# Patient Record
Sex: Female | Born: 1990 | Race: Black or African American | Hispanic: No | Marital: Married | Smoking: Former smoker
Health system: Southern US, Community
[De-identification: ages and names within clinical notes are randomized; demographics above are authoritative.]

## PROBLEM LIST (undated history)

## (undated) ENCOUNTER — Inpatient Hospital Stay (HOSPITAL_COMMUNITY): Payer: Self-pay

## (undated) DIAGNOSIS — A599 Trichomoniasis, unspecified: Secondary | ICD-10-CM

## (undated) DIAGNOSIS — D649 Anemia, unspecified: Secondary | ICD-10-CM

## (undated) DIAGNOSIS — A749 Chlamydial infection, unspecified: Secondary | ICD-10-CM

## (undated) HISTORY — PX: MULTIPLE TOOTH EXTRACTIONS: SHX2053

---
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
## 2012-08-16 ENCOUNTER — Other Ambulatory Visit (HOSPITAL_COMMUNITY)
Admission: RE | Admit: 2012-08-16 | Discharge: 2012-08-16 | Disposition: A | Discharge status: Home / Self Care | Payer: Self-pay | Source: Ambulatory Visit | Attending: Family Medicine | Admitting: Family Medicine

## 2012-08-16 ENCOUNTER — Encounter (HOSPITAL_COMMUNITY): Payer: Self-pay | Admitting: Emergency Medicine

## 2012-08-16 ENCOUNTER — Emergency Department (INDEPENDENT_AMBULATORY_CARE_PROVIDER_SITE_OTHER)
Admission: EM | Admit: 2012-08-16 | Discharge: 2012-08-16 | Disposition: A | Discharge status: Home / Self Care | Payer: Self-pay | Source: Home / Self Care | Attending: Family Medicine | Admitting: Family Medicine

## 2012-08-16 DIAGNOSIS — N760 Acute vaginitis: Secondary | ICD-10-CM

## 2012-08-16 DIAGNOSIS — Z113 Encounter for screening for infections with a predominantly sexual mode of transmission: Secondary | ICD-10-CM | POA: Insufficient documentation

## 2012-08-16 MED ORDER — METRONIDAZOLE 0.75 % VA GEL: 1.0000 | VAGINAL | Status: DC

## 2012-08-16 NOTE — ED Notes (Signed)
Pt c/o vag discharge x1 month... Sx include: foul odor, itching... Denies: urinary prob, fevers, vomiting, nauseas, diarrhea... She is alert w/no signs of acute distress.

## 2012-08-16 NOTE — ED Provider Notes (Signed)
History     CSN: [id number]  Arrival date & time 08/16/12  1800   First MD Initiated Contact with Patient 08/16/12 1827      Chief Complaint  Patient presents with  . Vaginal Discharge    (Consider location/radiation/quality/duration/timing/severity/associated sxs/prior treatment) Patient is a 21 y.o. female presenting with vaginal discharge. The history is provided by the patient.  Vaginal Discharge This is a new problem. The current episode started more than 1 week ago (1 month h/o d/c, , here today to get checked.). The problem has not changed since onset.Pertinent negatives include no abdominal pain.    History reviewed. No pertinent past medical history.  History reviewed. No pertinent past surgical history.  No family history on file.  History  Substance Use Topics  . Smoking status: Light Tobacco Smoker  . Smokeless tobacco: Not on file  . Alcohol Use: No    OB History    Grav Para Term Preterm Abortions TAB SAB Ect Mult Living                  Review of Systems  Constitutional: Negative.   Gastrointestinal: Negative.  Negative for abdominal pain.  Genitourinary: Positive for vaginal discharge. Negative for dysuria, urgency, frequency and dyspareunia.    Allergies  Review of patient's allergies indicates no known allergies.  Home Medications   Current Outpatient Rx  Name  Route  Sig  Dispense  Refill  . CALAMINE EX LOTN   Topical   Apply 1 application topically 3 (three) times daily.         Marland Kitchen DIPHENHYDRAMINE HCL 25 MG PO CAPS   Oral   Take 25 mg by mouth every 6 (six) hours as needed. For allergy relief         . METRONIDAZOLE 0.75 % VA GEL   Vaginal   Place 1 Applicatorful vaginally 1 day or 1 dose. At bedtime for 5 nights   70 g   0     BP 121/73  Pulse 84  Temp 98.5 F (36.9 C) (Oral)  Resp 16  SpO2 100%  LMP 07/14/2012  Physical Exam  Nursing note and vitals reviewed. Constitutional: She is oriented to person, place, and  time. She appears well-developed and well-nourished.  HENT:  Head: Normocephalic.  Abdominal: Soft. Bowel sounds are normal.  Genitourinary: Uterus normal. Uterus is not enlarged, not fixed and not tender. Cervix exhibits discharge. Cervix exhibits no motion tenderness and no friability. Right adnexum displays no tenderness. Left adnexum displays no tenderness. No erythema or tenderness around the vagina. Vaginal discharge found.  Neurological: She is alert and oriented to person, place, and time.  Skin: Skin is warm and dry.    ED Course  Procedures (including critical care time)  Labs Reviewed - No data to display No results found.   1. Vaginitis       MDM          Linna Hoff, MD 08/16/12 [phone]

## 2012-08-18 NOTE — ED Notes (Signed)
Patient states she was told Rx from 12/30 would be sent over to pharmacy, but pharmacy has no order.  REviewed record - informed pt that the Rx was a printed one, not an electronic one.  Pt denies ever having Rx paper given to her.  OK per Dr. Artis Flock to call Rx in.  Rx called to voicemail [facility] [address] [phone]): metronidazole (Metrogel Vaginal) 0.75% vaginal gel, place 1 applicatorful vaginally 1 day or 1 dose at bedtime for 5 nights, disp: 70g, no refills.

## 2012-08-20 NOTE — ED Notes (Signed)
GC/Chlamydia neg., Affirm: Candida and Trich neg., Gardnerella pos.  Pt. adequately treated with Metrogel. Vassie Moselle 08/20/2012

## 2013-07-23 ENCOUNTER — Encounter (HOSPITAL_COMMUNITY): Payer: Self-pay | Admitting: *Deleted

## 2013-07-23 ENCOUNTER — Inpatient Hospital Stay (HOSPITAL_COMMUNITY)
Admission: AD | Admit: 2013-07-23 | Discharge: 2013-07-23 | Disposition: A | Discharge status: Home / Self Care | Payer: Self-pay | Source: Ambulatory Visit | Attending: Obstetrics & Gynecology | Admitting: Obstetrics & Gynecology

## 2013-07-23 DIAGNOSIS — R109 Unspecified abdominal pain: Secondary | ICD-10-CM | POA: Insufficient documentation

## 2013-07-23 DIAGNOSIS — N946 Dysmenorrhea, unspecified: Secondary | ICD-10-CM | POA: Insufficient documentation

## 2013-07-23 DIAGNOSIS — D500 Iron deficiency anemia secondary to blood loss (chronic): Secondary | ICD-10-CM | POA: Insufficient documentation

## 2013-07-23 LAB — COMPREHENSIVE METABOLIC PANEL
Albumin: 4 g/dL (ref 3.5–5.2)
Alkaline Phosphatase: 41 U/L (ref 39–117)
BUN: 10 mg/dL (ref 6–23)
CO2: 22 mEq/L (ref 19–32)
Chloride: 103 mEq/L (ref 96–112)
GFR calc Af Amer: >90 mL/min (ref >90)
GFR calc non Af Amer: >90 mL/min (ref >90)
Glucose, Bld: 113 mg/dL — ABNORMAL HIGH (ref 70–99)
Potassium: 4.1 mEq/L (ref 3.5–5.1)
Total Bilirubin: 0.3 mg/dL (ref 0.3–1.2)

## 2013-07-23 LAB — URINE MICROSCOPIC-ADD ON

## 2013-07-23 LAB — URINALYSIS, ROUTINE W REFLEX MICROSCOPIC
Bilirubin Urine: NEGATIVE
Glucose, UA: NEGATIVE mg/dL
Specific Gravity, Urine: 1.01 (ref 1.005–1.030)

## 2013-07-23 LAB — CBC
HCT: 30.5 % — ABNORMAL LOW (ref 36.0–46.0)
Hemoglobin: 9.5 g/dL — ABNORMAL LOW (ref 12.0–15.0)
RBC: 4.15 MIL/uL (ref 3.87–5.11)
WBC: 6 10*3/uL (ref 4.0–10.5)

## 2013-07-23 LAB — WET PREP, GENITAL: Yeast Wet Prep HPF POC: NONE SEEN

## 2013-07-23 LAB — POCT PREGNANCY, URINE: Preg Test, Ur: NEGATIVE

## 2013-07-23 MED ORDER — PROMETHAZINE HCL 25 MG PO TABS: 12.5000 mg | ORAL_TABLET | Freq: Four times a day (QID) | ORAL | Status: DC | PRN

## 2013-07-23 MED ORDER — OXYCODONE-ACETAMINOPHEN 5-325 MG PO TABS
2.0000 | ORAL_TABLET | ORAL | Status: AC
  Administered 2013-07-23: 2 via ORAL
  Filled 2013-07-23: qty 2

## 2013-07-23 MED ORDER — DOCUSATE SODIUM 100 MG PO CAPS: 100.0000 mg | ORAL_CAPSULE | Freq: Two times a day (BID) | ORAL | Status: DC | PRN

## 2013-07-23 MED ORDER — ONDANSETRON 8 MG PO TBDP
8.0000 mg | ORAL_TABLET | ORAL | Status: AC
  Administered 2013-07-23: 8 mg via ORAL
  Filled 2013-07-23: qty 1

## 2013-07-23 MED ORDER — KETOROLAC TROMETHAMINE 60 MG/2ML IM SOLN
60.0000 mg | INTRAMUSCULAR | Status: AC
  Administered 2013-07-23: 60 mg via INTRAMUSCULAR
  Filled 2013-07-23: qty 2

## 2013-07-23 MED ORDER — FERROUS SULFATE 325 (65 FE) MG PO TABS: 325.0000 mg | ORAL_TABLET | Freq: Two times a day (BID) | ORAL | Status: DC

## 2013-07-23 NOTE — MAU Provider Note (Signed)
Chief Complaint: Abdominal Pain and Emesis   First Provider Initiated Contact with Patient 07/23/13 1323     SUBJECTIVE HPI: Erica [name] is a 22 y.o.  who presents to maternity admissions reporting abdominal cramping and nausea with vomiting x3 days and report of "feeling weak".  She is currently having her normal menses.  She reports her menses are regular, moderately heavy and last ~7 days.  She has had periods with cramping like this before but never the nausea/vomiting or weakness.  She denies exposure to known illness at home/school/work. She denies vaginal itching/burning, urinary symptoms, h/a, dizziness, or fever/chills.     Past Medical History  Diagnosis Date  . Medical history non-contributory    Past Surgical History  Procedure Laterality Date  . Multiple tooth extractions     History   Social History  . Marital Status: Single    Spouse Name: N/A    Number of Children: N/A  . Years of Education: N/A   Occupational History  . Not on file.   Social History Main Topics  . Smoking status: Light Tobacco Smoker  . Smokeless tobacco: Never Used  . Alcohol Use: No  . Drug Use: No  . Sexual Activity: Yes    Birth Control/ Protection: None   Other Topics Concern  . Not on file   Social History Narrative  . No narrative on file   No current facility-administered medications on file prior to encounter.   No current outpatient prescriptions on file prior to encounter.   No Known Allergies  ROS: Pertinent items in HPI  OBJECTIVE Blood pressure 97/52, pulse 67, temperature 98.1 F (36.7 C), temperature source Oral, resp. rate 22, height 5' 4.75" (1.645 m), weight 50.519 kg (111 lb 6 oz), last menstrual period 07/23/2013. GENERAL: Well-developed, well-nourished female in no acute distress.  HEENT: Normocephalic HEART: normal rate RESP: normal effort ABDOMEN: Soft, non-tender EXTREMITIES: Nontender, no edema NEURO: Alert and oriented Pelvic exam: Cervix  pink, visually closed, without lesion, moderate amount dark red bleeding without clots, vaginal walls and external genitalia normal Bimanual exam: Cervix 0/long/high, firm, anterior, neg CMT, uterus mildly tender, nonenlarged, adnexa without tenderness, enlargement, or mass  LAB RESULTS Results for orders placed during the hospital encounter of 07/23/13 (from the past 24 hour(s))  URINALYSIS, ROUTINE W REFLEX MICROSCOPIC     Status: Abnormal   Collection Time    07/23/13 12:45 PM      Result Value Range   Color, Urine YELLOW  YELLOW   APPearance CLEAR  CLEAR   Specific Gravity, Urine 1.010  1.005 - 1.030   pH 8.5 (*) 5.0 - 8.0   Glucose, UA NEGATIVE  NEGATIVE mg/dL   Hgb urine dipstick TRACE (*) NEGATIVE   Bilirubin Urine NEGATIVE  NEGATIVE   Ketones, ur 15 (*) NEGATIVE mg/dL   Protein, ur 161 (*) NEGATIVE mg/dL   Urobilinogen, UA 1.0  0.0 - 1.0 mg/dL   Nitrite NEGATIVE  NEGATIVE   Leukocytes, UA NEGATIVE  NEGATIVE  URINE MICROSCOPIC-ADD ON     Status: None   Collection Time    07/23/13 12:45 PM      Result Value Range   Squamous Epithelial / LPF RARE  RARE   WBC, UA 0-2  <3 WBC/hpf   RBC / HPF 3-6  <3 RBC/hpf   Bacteria, UA RARE  RARE   Urine-Other MUCOUS PRESENT    POCT PREGNANCY, URINE     Status: None   Collection Time    07/23/13  1:21 PM      Result Value Range   Preg Test, Ur NEGATIVE  NEGATIVE  CBC     Status: Abnormal   Collection Time    07/23/13  1:34 PM      Result Value Range   WBC 6.0  4.0 - 10.5 K/uL   RBC 4.15  3.87 - 5.11 MIL/uL   Hemoglobin 9.5 (*) 12.0 - 15.0 g/dL   HCT 40.9 (*) 81.1 - 91.4 %   MCV 73.5 (*) 78.0 - 100.0 fL   MCH 22.9 (*) 26.0 - 34.0 pg   MCHC 31.1  30.0 - 36.0 g/dL   RDW 78.2 (*) 95.6 - 21.3 %   Platelets 285  150 - 400 K/uL  COMPREHENSIVE METABOLIC PANEL     Status: Abnormal   Collection Time    07/23/13  1:34 PM      Result Value Range   Sodium 137  135 - 145 mEq/L   Potassium 4.1  3.5 - 5.1 mEq/L   Chloride 103  96 - 112  mEq/L   CO2 22  19 - 32 mEq/L   Glucose, Bld 113 (*) 70 - 99 mg/dL   BUN 10  6 - 23 mg/dL   Creatinine, Ser 0.86  0.50 - 1.10 mg/dL   Calcium 9.2  8.4 - 57.8 mg/dL   Total Protein 7.7  6.0 - 8.3 g/dL   Albumin 4.0  3.5 - 5.2 g/dL   AST 23  0 - 37 U/L   ALT 10  0 - 35 U/L   Alkaline Phosphatase 41  39 - 117 U/L   Total Bilirubin 0.3  0.3 - 1.2 mg/dL   GFR calc non Af Amer >90  >90 mL/min   GFR calc Af Amer >90  >90 mL/min  WET PREP, GENITAL     Status: Abnormal   Collection Time    07/23/13  2:10 PM      Result Value Range   Yeast Wet Prep HPF POC NONE SEEN  NONE SEEN   Trich, Wet Prep NONE SEEN  NONE SEEN   Clue Cells Wet Prep HPF POC NONE SEEN  NONE SEEN   WBC, Wet Prep HPF POC FEW (*) NONE SEEN    ASSESSMENT 1. Dysmenorrhea   2. Iron deficiency anemia due to chronic blood loss     PLAN Discharge home Continue OTC ibuprofen for pain Ferrous sulfate BID for symptomatic anemia Colace BID PRN Phenergan 12.5-25 mg Q6 hours PRN F/U in [facility]OC as needed    Medication List         docusate sodium 100 MG capsule  Commonly known as:  COLACE  Take 1 capsule (100 mg total) by mouth 2 (two) times daily as needed.     ferrous sulfate 325 (65 FE) MG tablet  Commonly known as:  FERROUSUL  Take 1 tablet (325 mg total) by mouth 2 (two) times daily.     promethazine 25 MG tablet  Commonly known as:  PHENERGAN  Take 0.5-1 tablets (12.5-25 mg total) by mouth every 6 (six) hours as needed for nausea or vomiting.       Follow-up Information   Follow up with [facility]. (As needed)    Specialty:  Obstetrics and Gynecology   Contact information:   [address] [place] [address] [phone]      Sharen Counter Certified Nurse-Midwife 07/23/2013  2:51 PM

## 2013-07-23 NOTE — MAU Note (Signed)
Patient presents with complaint of severe cramps ( presently on her menstrual cycle) and vomiting X 3 days.

## 2013-07-25 LAB — GC/CHLAMYDIA PROBE AMP: CT Probe RNA: NEGATIVE

## 2014-01-04 ENCOUNTER — Encounter (HOSPITAL_COMMUNITY): Payer: Self-pay | Admitting: Emergency Medicine

## 2014-01-04 ENCOUNTER — Emergency Department (HOSPITAL_COMMUNITY)
Admission: EM | Admit: 2014-01-04 | Discharge: 2014-01-05 | Disposition: A | Discharge status: Home / Self Care | Payer: Self-pay | Attending: Emergency Medicine | Admitting: Emergency Medicine

## 2014-01-04 DIAGNOSIS — Z79899 Other long term (current) drug therapy: Secondary | ICD-10-CM | POA: Insufficient documentation

## 2014-01-04 DIAGNOSIS — R112 Nausea with vomiting, unspecified: Secondary | ICD-10-CM | POA: Insufficient documentation

## 2014-01-04 DIAGNOSIS — R1110 Vomiting, unspecified: Secondary | ICD-10-CM

## 2014-01-04 DIAGNOSIS — D649 Anemia, unspecified: Secondary | ICD-10-CM | POA: Insufficient documentation

## 2014-01-04 DIAGNOSIS — R109 Unspecified abdominal pain: Secondary | ICD-10-CM

## 2014-01-04 DIAGNOSIS — F411 Generalized anxiety disorder: Secondary | ICD-10-CM | POA: Insufficient documentation

## 2014-01-04 DIAGNOSIS — R1013 Epigastric pain: Secondary | ICD-10-CM | POA: Insufficient documentation

## 2014-01-04 DIAGNOSIS — Z3202 Encounter for pregnancy test, result negative: Secondary | ICD-10-CM | POA: Insufficient documentation

## 2014-01-04 DIAGNOSIS — F17200 Nicotine dependence, unspecified, uncomplicated: Secondary | ICD-10-CM | POA: Insufficient documentation

## 2014-01-04 HISTORY — DX: Anemia, unspecified: D64.9

## 2014-01-04 LAB — COMPREHENSIVE METABOLIC PANEL
ALBUMIN: 4.4 g/dL (ref 3.5–5.2)
ALT: 15 U/L (ref 0–35)
AST: 27 U/L (ref 0–37)
Alkaline Phosphatase: 50 U/L (ref 39–117)
BILIRUBIN TOTAL: 0.7 mg/dL (ref 0.3–1.2)
BUN: 11 mg/dL (ref 6–23)
CO2: 20 mEq/L (ref 19–32)
Calcium: 10.1 mg/dL (ref 8.4–10.5)
Chloride: 100 mEq/L (ref 96–112)
Creatinine, Ser: 0.89 mg/dL (ref 0.50–1.10)
GFR calc Af Amer: >90 mL/min (ref >90)
GFR calc non Af Amer: >90 mL/min (ref >90)
Glucose, Bld: 118 mg/dL — ABNORMAL HIGH (ref 70–99)
Potassium: 3.2 mEq/L — ABNORMAL LOW (ref 3.7–5.3)
SODIUM: 141 meq/L (ref 137–147)
TOTAL PROTEIN: 8.7 g/dL — AB (ref 6.0–8.3)

## 2014-01-04 LAB — CBC WITH DIFFERENTIAL/PLATELET
BASOS PCT: 1 % (ref 0–1)
Basophils Absolute: 0.1 10*3/uL (ref 0.0–0.1)
EOS ABS: 0 10*3/uL (ref 0.0–0.7)
Eosinophils Relative: 0 % (ref 0–5)
HCT: 37.8 % (ref 36.0–46.0)
HEMOGLOBIN: 12.7 g/dL (ref 12.0–15.0)
LYMPHS ABS: 1.2 10*3/uL (ref 0.7–4.0)
Lymphocytes Relative: 20 % (ref 12–46)
MCH: 28.6 pg (ref 26.0–34.0)
MCHC: 33.6 g/dL (ref 30.0–36.0)
MCV: 85.1 fL (ref 78.0–100.0)
MONOS PCT: 5 % (ref 3–12)
Monocytes Absolute: 0.3 10*3/uL (ref 0.1–1.0)
NEUTROS ABS: 4.5 10*3/uL (ref 1.7–7.7)
Neutrophils Relative %: 74 % (ref 43–77)
Platelets: 386 10*3/uL (ref 150–400)
RBC: 4.44 MIL/uL (ref 3.87–5.11)
RDW: 13.4 % (ref 11.5–15.5)
WBC: 6.1 10*3/uL (ref 4.0–10.5)

## 2014-01-04 MED ORDER — ONDANSETRON 4 MG PO TBDP: 4.0000 mg | ORAL_TABLET | Freq: Once | ORAL | Status: DC

## 2014-01-04 MED ORDER — SODIUM CHLORIDE 0.9 % IV SOLN
Freq: Once | INTRAVENOUS | Status: AC
  Administered 2014-01-04: 23:00:00 via INTRAVENOUS

## 2014-01-04 MED ORDER — LORAZEPAM 2 MG/ML IJ SOLN
0.5000 mg | Freq: Once | INTRAMUSCULAR | Status: AC
  Administered 2014-01-04: 0.5 mg via INTRAVENOUS
  Filled 2014-01-04: qty 1

## 2014-01-04 NOTE — ED Provider Notes (Signed)
CSN: [id number]     Arrival date & time 01/04/14  2151 History   First MD Initiated Contact with Patient 01/04/14 2206     Chief Complaint  Patient presents with  . Emesis  . Dizziness     (Consider location/radiation/quality/duration/timing/severity/associated sxs/prior Treatment) HPI Comments: Patient presents with complaint of nausea, vomiting, and lower abdominal cramping associated with the onset of her menstrual period beginning at approximately 10 AM today. Patient has had symptoms similar to this over the past year. No past surgical history. No fever, diarrhea, dysuria. Patient is having normal amount of vaginal bleeding for her period. No treatments prior to arrival. Patient states she feels dizzy described as a lightheadedness. She's been told that she's been anemic in the past. Onset of symptoms gradual. Course is gradually worsening. Nothing makes symptoms better worse.  Patient is a 23 y.o. female presenting with vomiting and dizziness. The history is provided by the patient.  Emesis Associated symptoms: abdominal pain   Associated symptoms: no diarrhea, no headaches, no myalgias and no sore throat   Dizziness Associated symptoms: nausea and vomiting   Associated symptoms: no chest pain, no diarrhea and no headaches     Past Medical History  Diagnosis Date  . Medical history non-contributory   . Anemia    Past Surgical History  Procedure Laterality Date  . Multiple tooth extractions     No family history on file. History  Substance Use Topics  . Smoking status: Heavy Tobacco Smoker -- 0.50 packs/day    Types: Cigarettes  . Smokeless tobacco: Never Used  . Alcohol Use: No   OB History   Grav Para Term Preterm Abortions TAB SAB Ect Mult Living                 Review of Systems  Constitutional: Negative for fever.  HENT: Negative for rhinorrhea and sore throat.   Eyes: Negative for redness.  Respiratory: Negative for cough.   Cardiovascular: Negative for  chest pain.  Gastrointestinal: Positive for nausea, vomiting and abdominal pain. Negative for diarrhea.  Genitourinary: Positive for vaginal bleeding. Negative for dysuria, frequency, hematuria, flank pain, vaginal discharge and pelvic pain.  Musculoskeletal: Negative for myalgias.  Skin: Negative for rash.  Neurological: Positive for dizziness and light-headedness. Negative for headaches.  Psychiatric/Behavioral: The patient is nervous/anxious.     Allergies  Review of patient's allergies indicates no known allergies.  Home Medications   Prior to Admission medications   Medication Sig Start Date End Date Taking? Authorizing Provider  bismuth subsalicylate (PEPTO BISMOL) 262 MG chewable tablet Chew 524 mg by mouth as needed (for nausea/vomiting).   Yes Historical Provider, MD  diphenhydramine-acetaminophen (TYLENOL PM) 25-500 MG TABS Take 1-2 tablets by mouth as needed (for sleep).   Yes Historical Provider, MD  ferrous sulfate (FERROUSUL) 325 (65 FE) MG tablet Take 1 tablet (325 mg total) by mouth 2 (two) times daily. 07/23/13  Yes Lisa A Leftwich-Kirby, CNM   BP 101/68  Pulse 52  Temp(Src) 97.9 F (36.6 C) (Oral)  Resp 18  Ht 5\' 4"  (1.626 m)  Wt 110 lb (49.896 kg)  BMI 18.87 kg/m2  SpO2 100%  LMP 01/04/2014  Physical Exam  Nursing note and vitals reviewed. Constitutional: She appears well-developed and well-nourished.  Patient is very anxious and hyperventilating.  HENT:  Head: Normocephalic and atraumatic.  Eyes: Conjunctivae are normal. Right eye exhibits no discharge. Left eye exhibits no discharge.  Neck: Normal range of motion. Neck supple.  Cardiovascular: Normal  rate, regular rhythm and normal heart sounds.   Pulmonary/Chest: Effort normal and breath sounds normal. No respiratory distress. She has no wheezes. She has no rales.  Mild tachypnea  Abdominal: Soft. She exhibits no distension. There is tenderness (Mild, lower abdominal). There is no rebound and no  guarding.  Neurological: She is alert.  Skin: Skin is warm and dry.  Psychiatric: Her mood appears anxious.    ED Course  Procedures (including critical care time) Labs Review Labs Reviewed  COMPREHENSIVE METABOLIC PANEL - Abnormal; Notable for the following:    Potassium 3.2 (*)    Glucose, Bld 118 (*)    Total Protein 8.7 (*)    All other components within normal limits  CBC WITH DIFFERENTIAL  URINALYSIS, ROUTINE W REFLEX MICROSCOPIC  POC URINE PREG, ED    Imaging Review No results found.   EKG Interpretation None      10:39 PM Patient seen and examined. Work-up initiated. Medications ordered.   Vital signs reviewed and are as follows: Filed Vitals:   01/04/14 2201  BP: 101/68  Pulse: 52  Temp: 97.9 F (36.6 C)  Resp: 18   12:19 AM Patient feeling better after antiemetics. Epigastric pain is 4/10. Suprapubic pain is 1/10. She has been unable to urinate. She will start trying ice chips and I have ordered additional bolus. UA/UPT pending.   1:02 AM UPT neg. Manus RuddSchulz NP to f/u on UA. Ensure no infection.   Otherwise patient can go home with pain and nausea medication.   MDM   Final diagnoses:  Vomiting  Abdominal pain   Abdominal cramping and vomiting co-insiding with onset of her period. She has had these symptoms in past which have resolved. No fever. UA pending. Labs reassuring. Epigastric pain likely 2/2 vomiting and suprapubic pain likely 2/2 menstrual cramping.   Vitals are stable, no fever.  No signs of dehydration, tolerating PO's. Lungs are clear. No focal abdominal pain, no concern for appendicitis, cholecystitis, pancreatitis, ruptured viscus, UTI, kidney stone, or any other abdominal etiology. Supportive therapy indicated with return if symptoms worsen. Patient counseled.      Renne CriglerJoshua Salathiel Ferrara, PA-C 01/05/14 0107

## 2014-01-04 NOTE — ED Notes (Signed)
Per pt report: Pt has been vomiting since this morning.  Pt began vomiting at around 13:30 today.  Pt denied vomting any blood.  Pt still has nausea.  Pt reports feeling dizzy. Pt a/o x 4. Skin warm and dry. Ambulatory in triage.

## 2014-01-04 NOTE — ED Notes (Signed)
Pt is resting comfortably at this time.  Friend at bedside.

## 2014-01-04 NOTE — ED Notes (Signed)
Pt reports abd cramping with n/v since 1000 today.  Denies any diarrhea at this time.  Pt reports dizziness and tingling in her legs and arms and lips.  Pt is hyperventilating.  Pt instructed to slow her breathing down and explained to her the reason for the tingling sensations.

## 2014-01-05 LAB — URINALYSIS, ROUTINE W REFLEX MICROSCOPIC
Glucose, UA: NEGATIVE mg/dL
Ketones, ur: >80 mg/dL — AB
Leukocytes, UA: NEGATIVE
NITRITE: NEGATIVE
PH: 6.5 (ref 5.0–8.0)
Protein, ur: 100 mg/dL — AB
SPECIFIC GRAVITY, URINE: 1.043 — AB (ref 1.005–1.030)
UROBILINOGEN UA: 0.2 mg/dL (ref 0.0–1.0)

## 2014-01-05 LAB — POC URINE PREG, ED: PREG TEST UR: NEGATIVE

## 2014-01-05 LAB — URINE MICROSCOPIC-ADD ON

## 2014-01-05 MED ORDER — TRAMADOL HCL 50 MG PO TABS: 50.0000 mg | ORAL_TABLET | Freq: Four times a day (QID) | ORAL | Status: DC | PRN

## 2014-01-05 MED ORDER — SODIUM CHLORIDE 0.9 % IV BOLUS (SEPSIS)
1000.0000 mL | Freq: Once | INTRAVENOUS | Status: AC
  Administered 2014-01-05: 1000 mL via INTRAVENOUS

## 2014-01-05 MED ORDER — ONDANSETRON 4 MG PO TBDP
4.0000 mg | ORAL_TABLET | Freq: Once | ORAL | Status: AC
  Administered 2014-01-05: 4 mg via ORAL
  Filled 2014-01-05: qty 1

## 2014-01-05 MED ORDER — ONDANSETRON 4 MG PO TBDP: 4.0000 mg | ORAL_TABLET | Freq: Three times a day (TID) | ORAL | Status: DC | PRN

## 2014-01-05 NOTE — ED Notes (Signed)
Pt ambulated to the BR with steady gait.   

## 2014-01-05 NOTE — ED Provider Notes (Signed)
Medical screening examination/treatment/procedure(s) were performed by non-physician practitioner and as supervising physician I was immediately available for consultation/collaboration.   EKG Interpretation None        Layla MawKristen N Sanay Belmar, DO 01/05/14 1502

## 2014-01-05 NOTE — Discharge Instructions (Signed)
Please read and follow all provided instructions.  Your diagnoses today include:  1. Vomiting   2. Abdominal pain    Tests performed today include:  Blood counts and electrolytes  Blood tests to check liver and kidney function  Urine test to look for infection and pregnancy (in women)  Vital signs. See below for your results today.   Medications prescribed:   Zofran (ondansetron) - for nausea and vomiting   Tramadol - narcotic-like pain medication  DO NOT drive or perform any activities that require you to be awake and alert because this medicine can make you drowsy.   Take any prescribed medications only as directed.  Home care instructions:   Follow any educational materials contained in this packet.  Follow-up instructions: Please follow-up with your primary care provider in the next 3 days for further evaluation of your symptoms. If you do not have a primary care doctor -- see below for referral information.   Return instructions:  SEEK IMMEDIATE MEDICAL ATTENTION IF:  The pain does not go away or becomes severe   A temperature above 101F develops   Repeated vomiting occurs (multiple episodes)   The pain becomes localized to portions of the abdomen. The right side could possibly be appendicitis. In an adult, the left lower portion of the abdomen could be colitis or diverticulitis.   Blood is being passed in stools or vomit (bright red or black tarry stools)   You develop chest pain, difficulty breathing, dizziness or fainting, or become confused, poorly responsive, or inconsolable (young children)  If you have any other emergent concerns regarding your health  Additional Information: Abdominal (belly) pain can be caused by many things. Your caregiver performed an examination and possibly ordered blood/urine tests and imaging (CT scan, x-rays, ultrasound). Many cases can be observed and treated at home after initial evaluation in the emergency department. Even  though you are being discharged home, abdominal pain can be unpredictable. Therefore, you need a repeated exam if your pain does not resolve, returns, or worsens. Most patients with abdominal pain don't have to be admitted to the hospital or have surgery, but serious problems like appendicitis and gallbladder attacks can start out as nonspecific pain. Many abdominal conditions cannot be diagnosed in one visit, so follow-up evaluations are very important.  Your vital signs today were: BP 101/68   Pulse 52   Temp(Src) 97.9 F (36.6 C) (Oral)   Resp 18   Ht 5\' 4"  (1.626 m)   Wt 110 lb (49.896 kg)   BMI 18.87 kg/m2   SpO2 100%   LMP 01/04/2014 If your blood pressure (bp) was elevated above 135/85 this visit, please have this repeated by your doctor within one month. --------------

## 2014-02-06 ENCOUNTER — Emergency Department (HOSPITAL_COMMUNITY)
Admission: EM | Admit: 2014-02-06 | Discharge: 2014-02-06 | Disposition: A | Discharge status: Home / Self Care | Payer: Self-pay | Attending: Emergency Medicine | Admitting: Emergency Medicine

## 2014-02-06 ENCOUNTER — Emergency Department (HOSPITAL_COMMUNITY): Payer: Self-pay

## 2014-02-06 ENCOUNTER — Encounter (HOSPITAL_COMMUNITY): Payer: Self-pay | Admitting: Emergency Medicine

## 2014-02-06 DIAGNOSIS — D649 Anemia, unspecified: Secondary | ICD-10-CM | POA: Insufficient documentation

## 2014-02-06 DIAGNOSIS — Z3202 Encounter for pregnancy test, result negative: Secondary | ICD-10-CM | POA: Insufficient documentation

## 2014-02-06 DIAGNOSIS — R1084 Generalized abdominal pain: Secondary | ICD-10-CM | POA: Insufficient documentation

## 2014-02-06 DIAGNOSIS — N946 Dysmenorrhea, unspecified: Secondary | ICD-10-CM | POA: Insufficient documentation

## 2014-02-06 DIAGNOSIS — R112 Nausea with vomiting, unspecified: Secondary | ICD-10-CM | POA: Insufficient documentation

## 2014-02-06 DIAGNOSIS — F17200 Nicotine dependence, unspecified, uncomplicated: Secondary | ICD-10-CM | POA: Insufficient documentation

## 2014-02-06 DIAGNOSIS — Z79899 Other long term (current) drug therapy: Secondary | ICD-10-CM | POA: Insufficient documentation

## 2014-02-06 LAB — BASIC METABOLIC PANEL
BUN: 12 mg/dL (ref 6–23)
CALCIUM: 10.1 mg/dL (ref 8.4–10.5)
CO2: 20 meq/L (ref 19–32)
Chloride: 102 mEq/L (ref 96–112)
Creatinine, Ser: 0.77 mg/dL (ref 0.50–1.10)
GFR calc Af Amer: >90 mL/min (ref >90)
GFR calc non Af Amer: >90 mL/min (ref >90)
GLUCOSE: 131 mg/dL — AB (ref 70–99)
Potassium: 3.7 mEq/L (ref 3.7–5.3)
Sodium: 143 mEq/L (ref 137–147)

## 2014-02-06 LAB — URINALYSIS, ROUTINE W REFLEX MICROSCOPIC
Glucose, UA: NEGATIVE mg/dL
Ketones, ur: >80 mg/dL — AB
Leukocytes, UA: NEGATIVE
Nitrite: NEGATIVE
PROTEIN: 100 mg/dL — AB
Specific Gravity, Urine: 1.039 — ABNORMAL HIGH (ref 1.005–1.030)
Urobilinogen, UA: 1 mg/dL (ref 0.0–1.0)
pH: 7.5 (ref 5.0–8.0)

## 2014-02-06 LAB — CBC WITH DIFFERENTIAL/PLATELET
Basophils Absolute: 0 10*3/uL (ref 0.0–0.1)
Basophils Relative: 0 % (ref 0–1)
EOS ABS: 0 10*3/uL (ref 0.0–0.7)
EOS PCT: 0 % (ref 0–5)
HEMATOCRIT: 38.1 % (ref 36.0–46.0)
HEMOGLOBIN: 12.6 g/dL (ref 12.0–15.0)
LYMPHS ABS: 1.2 10*3/uL (ref 0.7–4.0)
LYMPHS PCT: 16 % (ref 12–46)
MCH: 28.2 pg (ref 26.0–34.0)
MCHC: 33.1 g/dL (ref 30.0–36.0)
MCV: 85.2 fL (ref 78.0–100.0)
MONO ABS: 0.3 10*3/uL (ref 0.1–1.0)
MONOS PCT: 4 % (ref 3–12)
Neutro Abs: 6 10*3/uL (ref 1.7–7.7)
Neutrophils Relative %: 80 % — ABNORMAL HIGH (ref 43–77)
Platelets: 351 10*3/uL (ref 150–400)
RBC: 4.47 MIL/uL (ref 3.87–5.11)
RDW: 13.9 % (ref 11.5–15.5)
WBC: 7.4 10*3/uL (ref 4.0–10.5)

## 2014-02-06 LAB — HEPATIC FUNCTION PANEL
ALT: 14 U/L (ref 0–35)
AST: 32 U/L (ref 0–37)
Albumin: 3.8 g/dL (ref 3.5–5.2)
Alkaline Phosphatase: 40 U/L (ref 39–117)
BILIRUBIN TOTAL: 0.4 mg/dL (ref 0.3–1.2)
Bilirubin, Direct: <0.2 mg/dL (ref 0.0–0.3)
Total Protein: 7.3 g/dL (ref 6.0–8.3)

## 2014-02-06 LAB — URINE MICROSCOPIC-ADD ON

## 2014-02-06 LAB — I-STAT TROPONIN, ED: Troponin i, poc: 0 ng/mL (ref 0.00–0.08)

## 2014-02-06 LAB — PREGNANCY, URINE: Preg Test, Ur: NEGATIVE

## 2014-02-06 LAB — LIPASE, BLOOD: Lipase: 18 U/L (ref 11–59)

## 2014-02-06 MED ORDER — ONDANSETRON 4 MG PO TBDP: 4.0000 mg | ORAL_TABLET | Freq: Three times a day (TID) | ORAL | Status: DC | PRN

## 2014-02-06 MED ORDER — MORPHINE SULFATE 4 MG/ML IJ SOLN
4.0000 mg | Freq: Once | INTRAMUSCULAR | Status: AC
  Administered 2014-02-06: 4 mg via INTRAVENOUS
  Filled 2014-02-06: qty 1

## 2014-02-06 MED ORDER — HYDROCODONE-ACETAMINOPHEN 5-325 MG PO TABS: 1.0000 | ORAL_TABLET | ORAL | Status: DC | PRN

## 2014-02-06 MED ORDER — ONDANSETRON HCL 4 MG/2ML IJ SOLN
4.0000 mg | Freq: Once | INTRAMUSCULAR | Status: AC
  Administered 2014-02-06: 4 mg via INTRAVENOUS
  Filled 2014-02-06: qty 2

## 2014-02-06 MED ORDER — SODIUM CHLORIDE 0.9 % IV BOLUS (SEPSIS)
1000.0000 mL | Freq: Once | INTRAVENOUS | Status: AC
  Administered 2014-02-06: 1000 mL via INTRAVENOUS

## 2014-02-06 NOTE — ED Provider Notes (Signed)
CSN: [id number]     Arrival date & time 02/06/14  [id number] History   First MD Initiated Contact with Patient 02/06/14 0901     Chief Complaint  Patient presents with  . Abdominal Pain  . Emesis     (Consider location/radiation/quality/duration/timing/severity/associated sxs/prior Treatment) HPI Comments: Patient is a 23 year old female with no past medical history who presents with abdominal pain that started last night. The pain is located in her generalized abdomen and does not radiate. The pain is described as cramping and severe. The pain started gradually and progressively worsened since the onset. No alleviating/aggravating factors. The patient has tried nothing for symptoms without relief. Associated symptoms include chest pain, nausea, and vomiting. Patient denies fever, headache, diarrhea, SOB, dysuria, constipation, abnormal vaginal bleeding/discharge. Patient is currently menstruating.      Past Medical History  Diagnosis Date  . Medical history non-contributory   . Anemia    Past Surgical History  Procedure Laterality Date  . Multiple tooth extractions     No family history on file. History  Substance Use Topics  . Smoking status: Heavy Tobacco Smoker -- 0.50 packs/day    Types: Cigarettes  . Smokeless tobacco: Never Used  . Alcohol Use: No   OB History   Grav Para Term Preterm Abortions TAB SAB Ect Mult Living                 Review of Systems  Constitutional: Negative for fever, chills and fatigue.  HENT: Negative for trouble swallowing.   Eyes: Negative for visual disturbance.  Respiratory: Negative for shortness of breath.   Cardiovascular: Negative for chest pain and palpitations.  Gastrointestinal: Positive for nausea, vomiting and abdominal pain. Negative for diarrhea.  Genitourinary: Negative for dysuria and difficulty urinating.  Musculoskeletal: Negative for arthralgias and neck pain.  Skin: Negative for color change.  Neurological: Negative for  dizziness and weakness.  Psychiatric/Behavioral: Negative for dysphoric mood.      Allergies  Review of patient's allergies indicates no known allergies.  Home Medications   Prior to Admission medications   Medication Sig Start Date End Date Taking? Authorizing Provider  bismuth subsalicylate (PEPTO BISMOL) 262 MG chewable tablet Chew 524 mg by mouth as needed (for nausea/vomiting).    Historical Provider, MD  diphenhydramine-acetaminophen (TYLENOL PM) 25-500 MG TABS Take 1-2 tablets by mouth as needed (for sleep).    Historical Provider, MD  ferrous sulfate (FERROUSUL) 325 (65 FE) MG tablet Take 1 tablet (325 mg total) by mouth 2 (two) times daily. 07/23/13   Lisa A Leftwich-Kirby, CNM  ondansetron (ZOFRAN ODT) 4 MG disintegrating tablet Take 1 tablet (4 mg total) by mouth every 8 (eight) hours as needed for nausea or vomiting. 01/05/14   Renne CriglerJoshua Geiple, PA-C  traMADol (ULTRAM) 50 MG tablet Take 1 tablet (50 mg total) by mouth every 6 (six) hours as needed. 01/05/14   Renne CriglerJoshua Geiple, PA-C   BP 114/57  Pulse 74  Temp(Src) 98.8 F (37.1 C) (Oral)  Resp 23  Ht 5\' 4"  (1.626 m)  Wt 110 lb (49.896 kg)  BMI 18.87 kg/m2  SpO2 100%  LMP 02/05/2014 Physical Exam  Nursing note and vitals reviewed. Constitutional: She is oriented to person, place, and time. She appears well-developed and well-nourished. No distress.  HENT:  Head: Normocephalic and atraumatic.  Eyes: Conjunctivae and EOM are normal. No scleral icterus.  Neck: Normal range of motion.  Cardiovascular: Normal rate and regular rhythm.  Exam reveals no gallop and no friction  rub.   No murmur heard. Pulmonary/Chest: Effort normal and breath sounds normal. She has no wheezes. She has no rales. She exhibits no tenderness.  Abdominal: Soft. She exhibits no distension. There is tenderness. There is no rebound and no guarding.  Generalized tenderness to palpation without focal tenderness or peritoneal signs.   Musculoskeletal: Normal  range of motion.  Neurological: She is alert and oriented to person, place, and time. Coordination normal.  Speech is goal-oriented. Moves limbs without ataxia.   Skin: Skin is warm and dry.  Psychiatric: She has a normal mood and affect. Her behavior is normal.    ED Course  Procedures (including critical care time) Labs Review Labs Reviewed  CBC WITH DIFFERENTIAL - Abnormal; Notable for the following:    Neutrophils Relative % 80 (*)    All other components within normal limits  BASIC METABOLIC PANEL - Abnormal; Notable for the following:    Glucose, Bld 131 (*)    All other components within normal limits  URINALYSIS, ROUTINE W REFLEX MICROSCOPIC - Abnormal; Notable for the following:    Color, Urine AMBER (*)    APPearance CLOUDY (*)    Specific Gravity, Urine 1.039 (*)    Hgb urine dipstick LARGE (*)    Bilirubin Urine SMALL (*)    Ketones, ur >80 (*)    Protein, ur 100 (*)    All other components within normal limits  PREGNANCY, URINE  URINE MICROSCOPIC-ADD ON  LIPASE, BLOOD  HEPATIC FUNCTION PANEL  I-STAT TROPOININ, ED    Imaging Review Dg Chest 2 View  02/06/2014   CLINICAL DATA:  Chest pain, vomiting  EXAM: CHEST  2 VIEW  COMPARISON:  None.  FINDINGS: The heart size and mediastinal contours are within normal limits. Both lungs are clear. The visualized skeletal structures are unremarkable.  IMPRESSION: No active cardiopulmonary disease.   Electronically Signed   By: Charlett NoseKevin  Dover M.D.   On: 02/06/2014 11:27     EKG Interpretation   Date/Time:  Monday February 06 2014 08:56:23 EDT Ventricular Rate:  56 PR Interval:  126 QRS Duration: 86 QT Interval:  458 QTC Calculation: 441 R Axis:   91 Text Interpretation:  Sinus bradycardia Rightward axis T wave abnormality,  consider inferior ischemia T wave abnormality, consider anterolateral  ischemia Abnormal ECG No previous tracing Confirmed by KNAPP  MD-J, JON  ([id number]) on 02/06/2014 10:30:25 AM      MDM   Final  diagnoses:  Menstrual cramps  Nausea and vomiting in adult    9:34 AM Labs and urinalysis pending. Patient will have fluids, morphine, and zofran for symptoms. Vitals stable and patient afebrile. Patient is PERC negative.   1:01 PM Labs and chest xray unremarkable for acute changes. EKG shows T wave abnormality. Troponin negative. Patient is eating lunch now. Patient likely has viral illness. Vitals stable and patient feeling "much better." Patient will be discharged with phenergan for nausea. Patient advised to return with worsening or concerning symptoms. Patient given coupons for zofran and pain medication. Patient is being set up with the orange card program.     Emilia BeckKaitlyn Szekalski, PA-C 02/06/14 1340

## 2014-02-06 NOTE — ED Notes (Signed)
Pt. Stated, i started throwing up last night around 0100 and having abdominal pain and chest pain

## 2014-02-06 NOTE — Discharge Instructions (Signed)
[facility] symptoms. Take Vicodin as needed for pain. Take zofran as needed for nausea. Refer to attached documents for more information.

## 2014-02-06 NOTE — ED Provider Notes (Signed)
Medical screening examination/treatment/procedure(s) were performed by non-physician practitioner and as supervising physician I was immediately available for consultation/collaboration.   EKG Interpretation   Date/Time:  Monday February 06 2014 08:56:23 EDT Ventricular Rate:  56 PR Interval:  126 QRS Duration: 86 QT Interval:  458 QTC Calculation: 441 R Axis:   91 Text Interpretation:  Sinus bradycardia Rightward axis T wave abnormality,  consider inferior ischemia T wave abnormality, consider anterolateral  ischemia Abnormal ECG No previous tracing Confirmed by Erica Pineiro  MD-J, Erica [name]  ([id number]) on 02/06/2014 10:30:25 AM       Erica DibblesJon Bode Pieper, MD 02/06/14 1346

## 2014-02-07 NOTE — Discharge Planning (Signed)
[id number] Community Liaison  Spoke to patient about primary care resources and establishing care with a provider. Patient was given the orange card application and instructed to contact me once the application was complete. Resource guide and my contact information was also given for any future questions or concerns.

## 2014-05-14 ENCOUNTER — Encounter (HOSPITAL_COMMUNITY): Payer: Self-pay | Admitting: Emergency Medicine

## 2014-05-14 ENCOUNTER — Emergency Department (HOSPITAL_COMMUNITY)
Admission: EM | Admit: 2014-05-14 | Discharge: 2014-05-15 | Disposition: A | Discharge status: Home / Self Care | Payer: Self-pay | Attending: Emergency Medicine | Admitting: Emergency Medicine

## 2014-05-14 DIAGNOSIS — N390 Urinary tract infection, site not specified: Secondary | ICD-10-CM | POA: Insufficient documentation

## 2014-05-14 DIAGNOSIS — Z3202 Encounter for pregnancy test, result negative: Secondary | ICD-10-CM | POA: Insufficient documentation

## 2014-05-14 DIAGNOSIS — F17200 Nicotine dependence, unspecified, uncomplicated: Secondary | ICD-10-CM | POA: Insufficient documentation

## 2014-05-14 DIAGNOSIS — R630 Anorexia: Secondary | ICD-10-CM | POA: Insufficient documentation

## 2014-05-14 DIAGNOSIS — R112 Nausea with vomiting, unspecified: Secondary | ICD-10-CM | POA: Insufficient documentation

## 2014-05-14 DIAGNOSIS — Z79899 Other long term (current) drug therapy: Secondary | ICD-10-CM | POA: Insufficient documentation

## 2014-05-14 DIAGNOSIS — R42 Dizziness and giddiness: Secondary | ICD-10-CM | POA: Insufficient documentation

## 2014-05-14 DIAGNOSIS — D649 Anemia, unspecified: Secondary | ICD-10-CM | POA: Insufficient documentation

## 2014-05-14 LAB — URINALYSIS, ROUTINE W REFLEX MICROSCOPIC
Glucose, UA: 100 mg/dL — AB
Ketones, ur: >80 mg/dL — AB
Nitrite: POSITIVE — AB
PH: 5 (ref 5.0–8.0)
Protein, ur: >300 mg/dL — AB
Specific Gravity, Urine: 1.035 — ABNORMAL HIGH (ref 1.005–1.030)
Urobilinogen, UA: 2 mg/dL — ABNORMAL HIGH (ref 0.0–1.0)

## 2014-05-14 LAB — CBC WITH DIFFERENTIAL/PLATELET
BASOS ABS: 0.1 10*3/uL (ref 0.0–0.1)
BASOS PCT: 1 % (ref 0–1)
EOS ABS: 0.1 10*3/uL (ref 0.0–0.7)
Eosinophils Relative: 1 % (ref 0–5)
HCT: 36.7 % (ref 36.0–46.0)
Hemoglobin: 12.3 g/dL (ref 12.0–15.0)
Lymphocytes Relative: 30 % (ref 12–46)
Lymphs Abs: 1.9 10*3/uL (ref 0.7–4.0)
MCH: 28.6 pg (ref 26.0–34.0)
MCHC: 33.5 g/dL (ref 30.0–36.0)
MCV: 85.3 fL (ref 78.0–100.0)
Monocytes Absolute: 0.5 10*3/uL (ref 0.1–1.0)
Monocytes Relative: 8 % (ref 3–12)
Neutro Abs: 3.9 10*3/uL (ref 1.7–7.7)
Neutrophils Relative %: 60 % (ref 43–77)
Platelets: 279 10*3/uL (ref 150–400)
RBC: 4.3 MIL/uL (ref 3.87–5.11)
RDW: 14.1 % (ref 11.5–15.5)
WBC: 6.4 10*3/uL (ref 4.0–10.5)

## 2014-05-14 LAB — URINE MICROSCOPIC-ADD ON

## 2014-05-14 LAB — COMPREHENSIVE METABOLIC PANEL
ALK PHOS: 46 U/L (ref 39–117)
ALT: 13 U/L (ref 0–35)
AST: 25 U/L (ref 0–37)
Albumin: 4.3 g/dL (ref 3.5–5.2)
Anion gap: 17 — ABNORMAL HIGH (ref 5–15)
BUN: 9 mg/dL (ref 6–23)
CO2: 21 mEq/L (ref 19–32)
Calcium: 9.5 mg/dL (ref 8.4–10.5)
Chloride: 103 mEq/L (ref 96–112)
Creatinine, Ser: 0.83 mg/dL (ref 0.50–1.10)
GFR calc Af Amer: >90 mL/min (ref >90)
GFR calc non Af Amer: >90 mL/min (ref >90)
Glucose, Bld: 86 mg/dL (ref 70–99)
POTASSIUM: 3.5 meq/L — AB (ref 3.7–5.3)
Sodium: 141 mEq/L (ref 137–147)
Total Bilirubin: 0.9 mg/dL (ref 0.3–1.2)
Total Protein: 7.9 g/dL (ref 6.0–8.3)

## 2014-05-14 LAB — LIPASE, BLOOD: Lipase: 16 U/L (ref 11–59)

## 2014-05-14 MED ORDER — CIPROFLOXACIN HCL 500 MG PO TABS: 500.0000 mg | ORAL_TABLET | Freq: Two times a day (BID) | ORAL | Status: DC

## 2014-05-14 MED ORDER — SODIUM CHLORIDE 0.9 % IV BOLUS (SEPSIS)
1000.0000 mL | Freq: Once | INTRAVENOUS | Status: AC
  Administered 2014-05-14: 1000 mL via INTRAVENOUS

## 2014-05-14 MED ORDER — ONDANSETRON HCL 4 MG/2ML IJ SOLN
4.0000 mg | Freq: Once | INTRAMUSCULAR | Status: AC
  Administered 2014-05-14: 4 mg via INTRAVENOUS
  Filled 2014-05-14: qty 2

## 2014-05-14 MED ORDER — CIPROFLOXACIN IN D5W 400 MG/200ML IV SOLN
400.0000 mg | Freq: Once | INTRAVENOUS | Status: AC
  Administered 2014-05-14: 400 mg via INTRAVENOUS
  Filled 2014-05-14: qty 200

## 2014-05-14 MED ORDER — HYDROCODONE-ACETAMINOPHEN 5-325 MG PO TABS: 1.0000 | ORAL_TABLET | Freq: Four times a day (QID) | ORAL | Status: DC | PRN

## 2014-05-14 NOTE — Discharge Instructions (Signed)
As discussed, it is important that you follow up as soon as possible with your physician for continued management of your condition. ° °If you develop any new, or concerning changes in your condition, please return to the emergency department immediately. ° °

## 2014-05-14 NOTE — ED Notes (Signed)
Introduced myself to the patient.  She is comfortable and resting. 

## 2014-05-14 NOTE — ED Provider Notes (Signed)
CSN: [id number]     Arrival date & time 05/14/14  1604 History   First MD Initiated Contact with Patient 05/14/14 1820     Chief Complaint  Patient presents with  . Emesis  . Dizziness     (Consider location/radiation/quality/duration/timing/severity/associated sxs/prior Treatment) HPI Patient presents with concern nausea, vomiting, diffuse abdominal pain.  Symptoms began when the patient awoke approximately 12 hours ago. Since onset she said diffuse crampy abdominal discomfort, persistent anorexia, nausea, with multiple episodes of emesis, 3 episodes of loose stool. There are associated subjective fever, chills. Patient notes that her boyfriend has similar symptoms. She is generally well-appearing  Past Medical History  Diagnosis Date  . Medical history non-contributory   . Anemia    Past Surgical History  Procedure Laterality Date  . Multiple tooth extractions     No family history on file. History  Substance Use Topics  . Smoking status: Heavy Tobacco Smoker -- 0.50 packs/day    Types: Cigarettes  . Smokeless tobacco: Never Used  . Alcohol Use: No   OB History   Grav Para Term Preterm Abortions TAB SAB Ect Mult Living                 Review of Systems  Constitutional:       Per HPI, otherwise negative  HENT:       Per HPI, otherwise negative  Respiratory:       Per HPI, otherwise negative  Cardiovascular:       Per HPI, otherwise negative  Gastrointestinal: Positive for nausea, vomiting and diarrhea.  Endocrine:       Negative aside from HPI  Genitourinary:       Neg aside from HPI   Musculoskeletal:       Per HPI, otherwise negative  Skin: Negative.   Neurological: Negative for syncope.      Allergies  Review of patient's allergies indicates no known allergies.  Home Medications   Prior to Admission medications   Medication Sig Start Date End Date Taking? Authorizing Provider  bismuth subsalicylate (PEPTO BISMOL) 262 MG chewable tablet Chew 524  mg by mouth as needed (for nausea/vomiting).    Historical Provider, MD  diphenhydramine-acetaminophen (TYLENOL PM) 25-500 MG TABS Take 1-2 tablets by mouth as needed (for sleep).    Historical Provider, MD  ferrous sulfate (FERROUSUL) 325 (65 FE) MG tablet Take 1 tablet (325 mg total) by mouth 2 (two) times daily. 07/23/13   Wilmer Floor Leftwich-Kirby, CNM  HYDROcodone-acetaminophen (NORCO/VICODIN) 5-325 MG per tablet Take 1-2 tablets by mouth every 4 (four) hours as needed for moderate pain or severe pain. 02/06/14   Kaitlyn Szekalski, PA-C  ondansetron (ZOFRAN ODT) 4 MG disintegrating tablet Take 1 tablet (4 mg total) by mouth every 8 (eight) hours as needed for nausea or vomiting. 02/06/14   Emilia Beck, PA-C  traMADol (ULTRAM) 50 MG tablet Take 50 mg by mouth every 6 (six) hours as needed for moderate pain.    Historical Provider, MD   BP 91/53  Pulse 68  Temp(Src) 98 F (36.7 C) (Oral)  Resp 20  SpO2 100% Physical Exam  Nursing note and vitals reviewed. Constitutional: She is oriented to person, place, and time. She appears well-developed and well-nourished. No distress.  HENT:  Head: Normocephalic and atraumatic.  Eyes: Conjunctivae and EOM are normal.  Cardiovascular: Normal rate and regular rhythm.   Pulmonary/Chest: Effort normal and breath sounds normal. No stridor. No respiratory distress.  Abdominal: She exhibits no distension. There  is generalized tenderness. There is no rigidity, no rebound and no guarding.  Musculoskeletal: She exhibits no edema.  Neurological: She is alert and oriented to person, place, and time. No cranial nerve deficit.  Skin: Skin is warm and dry.  Psychiatric: She has a normal mood and affect.    ED Course  Procedures (including critical care time) Labs Review Labs Reviewed  COMPREHENSIVE METABOLIC PANEL - Abnormal; Notable for the following:    Potassium 3.5 (*)    Anion gap 17 (*)    All other components within normal limits  URINALYSIS,  ROUTINE W REFLEX MICROSCOPIC - Abnormal; Notable for the following:    Color, Urine RED (*)    APPearance TURBID (*)    Specific Gravity, Urine 1.035 (*)    Glucose, UA 100 (*)    Hgb urine dipstick LARGE (*)    Bilirubin Urine LARGE (*)    Ketones, ur >80 (*)    Protein, ur >300 (*)    Urobilinogen, UA 2.0 (*)    Nitrite POSITIVE (*)    Leukocytes, UA LARGE (*)    All other components within normal limits  URINE MICROSCOPIC-ADD ON - Abnormal; Notable for the following:    Bacteria, UA MANY (*)    All other components within normal limits  URINE CULTURE  CBC WITH DIFFERENTIAL  LIPASE, BLOOD    11:41 PM On repeat exam the patient is awake, alert, in no distress. MDM   Final diagnoses:  UTI (lower urinary tract infection)    Previously well young female presents with new diffuse abdominal discomfort, nausea, vomiting, no sign of a urinary tract infection.  Patient is afebrile in no distress. Low suspicion for pyelonephritis. Patient presents initially with fluids, antibiotics.  Patient was discharged in stable condition to follow up with primary care.,     Gerhard Munch, MD 05/15/14 [phone]

## 2014-05-14 NOTE — ED Notes (Signed)
Pt presents to department for evaluation of body aches, nausea/vomiting, dizziness and fatigue. Onset this morning. Pt is alert and oriented x4.

## 2014-05-14 NOTE — ED Notes (Signed)
Pt also states lower abdominal cramping.

## 2014-05-15 LAB — POC URINE PREG, ED: Preg Test, Ur: NEGATIVE

## 2014-05-15 NOTE — ED Notes (Signed)
Patient is alert and orientedx4.  Patient was explained discharge instructions and they understood them with no questions.  The patient's boyfriend, Erica [name] is taking the patient home.

## 2014-05-16 LAB — URINE CULTURE: Colony Count: 4000

## 2014-07-16 ENCOUNTER — Encounter (HOSPITAL_COMMUNITY): Payer: Self-pay | Admitting: *Deleted

## 2014-07-16 ENCOUNTER — Inpatient Hospital Stay (HOSPITAL_COMMUNITY)
Admission: AD | Admit: 2014-07-16 | Discharge: 2014-07-16 | Disposition: A | Discharge status: Home / Self Care | Payer: Medicaid Other | Source: Ambulatory Visit | Attending: Obstetrics & Gynecology | Admitting: Obstetrics & Gynecology

## 2014-07-16 DIAGNOSIS — B9689 Other specified bacterial agents as the cause of diseases classified elsewhere: Secondary | ICD-10-CM | POA: Insufficient documentation

## 2014-07-16 DIAGNOSIS — R112 Nausea with vomiting, unspecified: Secondary | ICD-10-CM | POA: Insufficient documentation

## 2014-07-16 DIAGNOSIS — N390 Urinary tract infection, site not specified: Secondary | ICD-10-CM | POA: Insufficient documentation

## 2014-07-16 DIAGNOSIS — N946 Dysmenorrhea, unspecified: Secondary | ICD-10-CM | POA: Insufficient documentation

## 2014-07-16 LAB — URINALYSIS, ROUTINE W REFLEX MICROSCOPIC
Bilirubin Urine: NEGATIVE
GLUCOSE, UA: NEGATIVE mg/dL
Ketones, ur: 15 mg/dL — AB
Nitrite: POSITIVE — AB
Specific Gravity, Urine: 1.015 (ref 1.005–1.030)
Urobilinogen, UA: 1 mg/dL (ref 0.0–1.0)

## 2014-07-16 LAB — URINE MICROSCOPIC-ADD ON

## 2014-07-16 LAB — POCT PREGNANCY, URINE: PREG TEST UR: NEGATIVE

## 2014-07-16 MED ORDER — CIPROFLOXACIN HCL 500 MG PO TABS: 500.0000 mg | ORAL_TABLET | Freq: Two times a day (BID) | ORAL | Status: DC

## 2014-07-16 MED ORDER — KETOROLAC TROMETHAMINE 30 MG/ML IJ SOLN
30.0000 mg | INTRAMUSCULAR | Status: AC
  Administered 2014-07-16: 30 mg via INTRAVENOUS
  Filled 2014-07-16: qty 1

## 2014-07-16 MED ORDER — SODIUM CHLORIDE 0.9 % IV SOLN
25.0000 mg | Freq: Once | INTRAVENOUS | Status: AC
  Administered 2014-07-16: 25 mg via INTRAVENOUS
  Filled 2014-07-16: qty 1

## 2014-07-16 MED ORDER — PROMETHAZINE HCL 25 MG PO TABS: 25.0000 mg | ORAL_TABLET | Freq: Four times a day (QID) | ORAL | Status: DC | PRN

## 2014-07-16 NOTE — MAU Provider Note (Signed)
History     CSN: [id number]  Arrival date and time: 07/16/14 1545   First Provider Initiated Contact with Patient 07/16/14 1625      Chief Complaint  Patient presents with  . Nausea  . Emesis   HPI Erica [name] 23 y.o. nonpregnant female presents to MAU complaining of nausea, vomiting and weakness since awakening at 8am.  She has no sick contacts.  She has not been able to eat or drink anything all day.  She has vomited 6 or 7 times.  She has some constipation and dizziness.  She denies headache, shortness of breath, chest pain.  She is on her menstrual cycle presently.   OB History    No data available      Past Medical History  Diagnosis Date  . Anemia     Past Surgical History  Procedure Laterality Date  . Multiple tooth extractions      History reviewed. No pertinent family history.  History  Substance Use Topics  . Smoking status: Heavy Tobacco Smoker -- 0.50 packs/day    Types: Cigarettes  . Smokeless tobacco: Never Used  . Alcohol Use: No    Allergies: No Known Allergies  Prescriptions prior to admission  Medication Sig Dispense Refill Last Dose  . ibuprofen (ADVIL,MOTRIN) 200 MG tablet Take 200 mg by mouth every 6 (six) hours as needed for cramping.   07/16/2014 at Unknown time  . traMADol (ULTRAM) 50 MG tablet Take 50 mg by mouth every 6 (six) hours as needed for moderate pain.   07/16/2014 at Unknown time  . ciprofloxacin (CIPRO) 500 MG tablet Take 1 tablet (500 mg total) by mouth 2 (two) times daily. (Patient not taking: Reported on 07/16/2014) 14 tablet 0   . ferrous sulfate (FERROUSUL) 325 (65 FE) MG tablet Take 1 tablet (325 mg total) by mouth 2 (two) times daily. (Patient not taking: Reported on 07/16/2014) 60 tablet 1 Past Month at Unknown time  . HYDROcodone-acetaminophen (NORCO/VICODIN) 5-325 MG per tablet Take 1 tablet by mouth every 6 (six) hours as needed for severe pain. (Patient not taking: Reported on 07/16/2014) 13 tablet 0     ROS  Pertinent ROS in HPI Physical Exam   Blood pressure 111/77, pulse 52, temperature 98 F (36.7 C), temperature source Oral, resp. rate 20, height 5\' 4"  (1.626 m), weight 109 lb (49.442 kg), last menstrual period 07/15/2014.  Physical Exam  Constitutional: She is oriented to person, place, and time. She appears well-developed and well-nourished.  HENT:  Head: Normocephalic and atraumatic.  Eyes: EOM are normal.  Neck: Normal range of motion.  Cardiovascular: Normal rate, regular rhythm and normal heart sounds.   Respiratory: Effort normal. No respiratory distress.  GI: Soft. Bowel sounds are normal. She exhibits no distension. There is tenderness. There is no rebound.  Diffuse tenderness to palpation throughout  Musculoskeletal: Normal range of motion.  Neurological: She is alert and oriented to person, place, and time.  Skin: Skin is warm and dry.  Psychiatric: She has a normal mood and affect.   Results for orders placed or performed during the hospital encounter of 07/16/14 (from the past 24 hour(s))  Urinalysis, Routine w reflex microscopic     Status: Abnormal   Collection Time: 07/16/14  3:55 PM  Result Value Ref Range   Color, Urine RED (A) YELLOW   APPearance TURBID (A) CLEAR   Specific Gravity, Urine 1.015 1.005 - 1.030   pH >9.0 (H) 5.0 - 8.0   Glucose, UA NEGATIVE  NEGATIVE mg/dL   Hgb urine dipstick LARGE (A) NEGATIVE   Bilirubin Urine NEGATIVE NEGATIVE   Ketones, ur 15 (A) NEGATIVE mg/dL   Protein, ur >161>300 (A) NEGATIVE mg/dL   Urobilinogen, UA 1.0 0.0 - 1.0 mg/dL   Nitrite POSITIVE (A) NEGATIVE   Leukocytes, UA TRACE (A) NEGATIVE  Urine microscopic-add on     Status: Abnormal   Collection Time: 07/16/14  3:55 PM  Result Value Ref Range   Squamous Epithelial / LPF RARE RARE   WBC, UA 0-2 <3 WBC/hpf   RBC / HPF TOO NUMEROUS TO COUNT <3 RBC/hpf   Bacteria, UA FEW (A) RARE   Urine-Other MUCOUS PRESENT   Pregnancy, urine POC     Status: None   Collection Time:  07/16/14  4:04 PM  Result Value Ref Range   Preg Test, Ur NEGATIVE NEGATIVE    MAU Course  Procedures  MDM Phenergan IV infusion started.  Within 15 minutes, pt acknowledges feeling better.  Abdominal exam repeated and pt continues diffuse tenderness.  Toradol IV added.  Upon checking on patient again, she notes resolution of her pain and all nausea/vomiting.  She reports she is ready to go home.    Assessment and Plan  A:  1. Urinary tract infection without hematuria, site unspecified   2. Nausea and vomiting, vomiting of unspecified type   3. Dysmenorrhea    P: Discharge to home Cipro Phenergan Increase fluids OTC Ibuprofen PRN Follow up with health department for contraception/management of menses Patient may return to MAU as needed or if her condition were to change or worsen   Bertram Denvereague Clark, Karen E 07/16/2014, 4:29 PM

## 2014-07-16 NOTE — MAU Note (Signed)
Pt presents to MAU with complaints of having nausea, vomiting, and weakness since this morning

## 2014-07-16 NOTE — Discharge Instructions (Signed)
Asymptomatic Bacteriuria Asymptomatic bacteriuria is the presence of a large number of bacteria in your urine without the usual symptoms of burning or frequent urination. The following conditions increase the risk of asymptomatic bacteriuria:  Diabetes mellitus.  Advanced age.  Pregnancy in the first trimester.  Kidney stones.  Kidney transplants.  Leaky kidney tube valve in young children (reflux). Treatment for this condition is not needed in most people and can lead to other problems such as too much yeast and growth of resistant bacteria. However, some people, such as pregnant women, do need treatment to prevent kidney infection. Asymptomatic bacteriuria in pregnancy is also associated with fetal growth restriction, premature labor, and newborn death. HOME CARE INSTRUCTIONS Monitor your condition for any changes. The following actions may help to relieve any discomfort you are feeling:  Drink enough water and fluids to keep your urine clear or pale yellow. Go to the bathroom more often to keep your bladder empty.  Keep the area around your vagina and rectum clean. Wipe yourself from front to back after urinating. SEEK IMMEDIATE MEDICAL CARE IF:  You develop signs of an infection such as:  Burning with urination.  Frequency of voiding.  Back pain.  Fever.  You have blood in the urine.  You develop a fever. MAKE SURE YOU:  Understand these instructions.  Will watch your condition.  Will get help right away if you are not doing well or get worse. Document Released: 08/04/2005 Document Revised: 12/19/2013 Document Reviewed: 01/24/2013 [facility] Patient Information 2015 [place], [place]. This information is not intended to replace advice given to you by your health care provider. Make sure you discuss any questions you have with your health care provider. Dysmenorrhea Menstrual cramps (dysmenorrhea) are caused by the muscles of the uterus tightening (contracting) during a  menstrual period. For some women, this discomfort is merely bothersome. For others, dysmenorrhea can be severe enough to interfere with everyday activities for a few days each month. Primary dysmenorrhea is menstrual cramps that last a couple of days when you start having menstrual periods or soon after. This often begins after a teenager starts having her period. As a woman gets older or has a baby, the cramps will usually lessen or disappear. Secondary dysmenorrhea begins later in life, lasts longer, and the pain may be stronger than primary dysmenorrhea. The pain may start before the period and last a few days after the period.  CAUSES  Dysmenorrhea is usually caused by an underlying problem, such as:  The tissue lining the uterus grows outside of the uterus in other areas of the body (endometriosis).  The endometrial tissue, which normally lines the uterus, is found in or grows into the muscular walls of the uterus (adenomyosis).  The pelvic blood vessels are engorged with blood just before the menstrual period (pelvic congestive syndrome).  Overgrowth of cells (polyps) in the lining of the uterus or cervix.  Falling down of the uterus (prolapse) because of loose or stretched ligaments.  Depression.  Bladder problems, infection, or inflammation.  Problems with the intestine, a tumor, or irritable bowel syndrome.  Cancer of the female organs or bladder.  A severely tipped uterus.  A very tight opening or closed cervix.  Noncancerous tumors of the uterus (fibroids).  Pelvic inflammatory disease (PID).  Pelvic scarring (adhesions) from a previous surgery.  Ovarian cyst.  An intrauterine device (IUD) used for birth control. RISK FACTORS You may be at greater risk of dysmenorrhea if:  You are younger than age 21.  You started puberty early.  You have irregular or heavy bleeding.  You have never given birth.  You have a family history of this problem.  You are a  smoker. SIGNS AND SYMPTOMS   Cramping or throbbing pain in your lower abdomen.  Headaches.  Lower back pain.  Nausea or vomiting.  Diarrhea.  Sweating or dizziness.  Loose stools. DIAGNOSIS  A diagnosis is based on your history, symptoms, physical exam, diagnostic tests, or procedures. Diagnostic tests or procedures may include:  Blood tests.  Ultrasonography.  An examination of the lining of the uterus (dilation and curettage, D&C).  An examination inside your abdomen or pelvis with a scope (laparoscopy).  X-rays.  CT scan.  MRI.  An examination inside the bladder with a scope (cystoscopy).  An examination inside the intestine or stomach with a scope (colonoscopy, gastroscopy). TREATMENT  Treatment depends on the cause of the dysmenorrhea. Treatment may include:  Pain medicine prescribed by your health care provider.  Birth control pills or an IUD with progesterone hormone in it.  Hormone replacement therapy.  Nonsteroidal anti-inflammatory drugs (NSAIDs). These may help stop the production of prostaglandins.  Surgery to remove adhesions, endometriosis, ovarian cyst, or fibroids.  Removal of the uterus (hysterectomy).  Progesterone shots to stop the menstrual period.  Cutting the nerves on the sacrum that go to the female organs (presacral neurectomy).  Electric current to the sacral nerves (sacral nerve stimulation).  Antidepressant medicine.  Psychiatric therapy, counseling, or group therapy.  Exercise and physical therapy.  Meditation and yoga therapy.  Acupuncture. HOME CARE INSTRUCTIONS   Only take over-the-counter or prescription medicines as directed by your health care provider.  Place a heating pad or hot water bottle on your lower back or abdomen. Do not sleep with the heating pad.  Use aerobic exercises, walking, swimming, biking, and other exercises to help lessen the cramping.  Massage to the lower back or abdomen may  help.  Stop smoking.  Avoid alcohol and caffeine. SEEK MEDICAL CARE IF:   Your pain does not get better with medicine.  You have pain with sexual intercourse.  Your pain increases and is not controlled with medicines.  You have abnormal vaginal bleeding with your period.  You develop nausea or vomiting with your period that is not controlled with medicine. SEEK IMMEDIATE MEDICAL CARE IF:  You pass out.  Document Released: 08/04/2005 Document Revised: 04/06/2013 Document Reviewed: 01/20/2013 [facility] Patient Information 2015 [place], [place]. This information is not intended to replace advice given to you by your health care provider. Make sure you discuss any questions you have with your health care provider.

## 2014-08-25 ENCOUNTER — Other Ambulatory Visit: Payer: Self-pay | Admitting: Physician Assistant

## 2015-02-02 ENCOUNTER — Encounter (HOSPITAL_COMMUNITY): Payer: Self-pay | Admitting: *Deleted

## 2015-02-02 ENCOUNTER — Emergency Department (HOSPITAL_COMMUNITY)
Admission: EM | Admit: 2015-02-02 | Discharge: 2015-02-02 | Disposition: A | Discharge status: Home / Self Care | Payer: Medicaid Other | Attending: Emergency Medicine | Admitting: Emergency Medicine

## 2015-02-02 DIAGNOSIS — Z720 Tobacco use: Secondary | ICD-10-CM | POA: Insufficient documentation

## 2015-02-02 DIAGNOSIS — N946 Dysmenorrhea, unspecified: Secondary | ICD-10-CM | POA: Insufficient documentation

## 2015-02-02 DIAGNOSIS — Z862 Personal history of diseases of the blood and blood-forming organs and certain disorders involving the immune mechanism: Secondary | ICD-10-CM | POA: Insufficient documentation

## 2015-02-02 MED ORDER — KETOROLAC TROMETHAMINE 60 MG/2ML IM SOLN
60.0000 mg | Freq: Once | INTRAMUSCULAR | Status: AC
  Administered 2015-02-02: 60 mg via INTRAMUSCULAR
  Filled 2015-02-02: qty 2

## 2015-02-02 MED ORDER — PROMETHAZINE HCL 25 MG RE SUPP: 25.0000 mg | Freq: Four times a day (QID) | RECTAL | Status: DC | PRN

## 2015-02-02 MED ORDER — NAPROXEN 500 MG PO TABS: 500.0000 mg | ORAL_TABLET | Freq: Two times a day (BID) | ORAL | Status: DC

## 2015-02-02 MED ORDER — DICYCLOMINE HCL 10 MG/ML IM SOLN
20.0000 mg | Freq: Once | INTRAMUSCULAR | Status: AC
  Administered 2015-02-02: 20 mg via INTRAMUSCULAR
  Filled 2015-02-02: qty 2

## 2015-02-02 MED ORDER — ONDANSETRON 8 MG PO TBDP
8.0000 mg | ORAL_TABLET | Freq: Once | ORAL | Status: AC
  Administered 2015-02-02: 8 mg via ORAL
  Filled 2015-02-02: qty 1

## 2015-02-02 MED ORDER — ONDANSETRON 8 MG PO TBDP: 8.0000 mg | ORAL_TABLET | Freq: Three times a day (TID) | ORAL | Status: DC | PRN

## 2015-02-02 MED ORDER — TRAMADOL HCL 50 MG PO TABS: 50.0000 mg | ORAL_TABLET | Freq: Four times a day (QID) | ORAL | Status: DC | PRN

## 2015-02-02 NOTE — ED Notes (Signed)
Bed: WA14 Expected date:  Expected time:  Means of arrival:  Comments: 

## 2015-02-02 NOTE — ED Notes (Signed)
Pt states that she took her Promethazine around 5pm for N/V due to menstrual cycle; pt states that it has not helped; pt c/o abd pain / cramping; N/V; pt reports vomiting x 10 times; pt states that she felt like she was going to pass out earlier; denies currently

## 2015-02-02 NOTE — ED Notes (Signed)
Pt reports she started her period yesterday and yesterday evening. She reports having nausea and vomiting since yesterday. She reports having pain in her lower abdomen and tender to touch.

## 2015-02-02 NOTE — ED Provider Notes (Signed)
CSN: [id number]     Arrival date & time 02/02/15  0145 History   First MD Initiated Contact with Patient 02/02/15 0407     Chief Complaint  Patient presents with  . Abdominal Pain     (Consider location/radiation/quality/duration/timing/severity/associated sxs/prior Treatment) HPI 24 yo female presents to the ER from home with complaint of pain and nausea associated with her menses.  Symptoms are similar to prior menses.  She took promethazine without improvement earlier this evening.  She has had multiple episodes of vomiting.  She reports lower pelvic pain.  She has local gyn, but has never discussed OCP or other treatments for her painful periods.  No fevers, chills, vaginal d/c, upper abdominal pain, bowel changes. Past Medical History  Diagnosis Date  . Anemia    Past Surgical History  Procedure Laterality Date  . Multiple tooth extractions     No family history on file. History  Substance Use Topics  . Smoking status: Current Every Day Smoker -- 0.50 packs/day    Types: Cigarettes  . Smokeless tobacco: Never Used  . Alcohol Use: Yes     Comment: occ   OB History    No data available     Review of Systems  Gastrointestinal: Positive for nausea and vomiting.  Genitourinary: Positive for vaginal bleeding, vaginal pain, menstrual problem and pelvic pain.  All other systems reviewed and are negative.     Allergies  Review of patient's allergies indicates no known allergies.  Home Medications   Prior to Admission medications   Medication Sig Start Date End Date Taking? Authorizing Provider  promethazine (PHENERGAN) 25 MG tablet Take 1 tablet (25 mg total) by mouth every 6 (six) hours as needed for nausea or vomiting. 07/16/14  Yes Scot Jun Teague Clark, PA-C  naproxen (NAPROSYN) 500 MG tablet Take 1 tablet (500 mg total) by mouth 2 (two) times daily. For pain 02/02/15   Marisa Severin, MD  ondansetron (ZOFRAN ODT) 8 MG disintegrating tablet Take 1 tablet (8 mg total) by  mouth every 8 (eight) hours as needed for nausea or vomiting. 02/02/15   Marisa Severin, MD  promethazine (PHENERGAN) 25 MG suppository Place 1 suppository (25 mg total) rectally every 6 (six) hours as needed for nausea or vomiting. 02/02/15   Marisa Severin, MD  traMADol (ULTRAM) 50 MG tablet Take 1 tablet (50 mg total) by mouth every 6 (six) hours as needed for moderate pain or severe pain. 02/02/15   Marisa Severin, MD   BP 93/48 mmHg  Pulse 51  Temp(Src) 98.6 F (37 C) (Oral)  Resp 16  SpO2 100%  LMP 02/01/2015 Physical Exam  Constitutional: She is oriented to person, place, and time. She appears well-developed and well-nourished.  HENT:  Head: Normocephalic and atraumatic.  Nose: Nose normal.  Mouth/Throat: Oropharynx is clear and moist.  Eyes: Conjunctivae and EOM are normal. Pupils are equal, round, and reactive to light.  Neck: Normal range of motion. Neck supple. No JVD present. No tracheal deviation present. No thyromegaly present.  Cardiovascular: Normal rate, regular rhythm, normal heart sounds and intact distal pulses.  Exam reveals no gallop and no friction rub.   No murmur heard. Pulmonary/Chest: Effort normal and breath sounds normal. No stridor. No respiratory distress. She has no wheezes. She has no rales. She exhibits no tenderness.  Abdominal: Soft. Bowel sounds are normal. She exhibits no distension and no mass. There is tenderness (ttp over lower abd without rebound or guarding). There is no rebound and no  guarding.  Musculoskeletal: Normal range of motion. She exhibits no edema or tenderness.  Lymphadenopathy:    She has no cervical adenopathy.  Neurological: She is alert and oriented to person, place, and time. She displays normal reflexes. She exhibits normal muscle tone. Coordination normal.  Skin: Skin is warm and dry. No rash noted. No erythema. No pallor.  Psychiatric: She has a normal mood and affect. Her behavior is normal. Judgment and thought content normal.  Nursing  note and vitals reviewed.   ED Course  Procedures (including critical care time) Labs Review Labs Reviewed - No data to display  Imaging Review No results found.   EKG Interpretation None      MDM   Final diagnoses:  Dysmenorrhea    24 year old female with dysmenorrhea.  She has had this ongoing for several years.  She has a gynecologist, but has not had a discussion about medications to help with these monthly episodes of pain, nausea and vomiting.  Patient with resolution of symptoms after Toradol, Bentyl and Zofran.  Patient encouraged to follow-up with her gynecologist.    Marisa Severin, MD 02/05/15 [phone]

## 2015-02-02 NOTE — Discharge Instructions (Signed)
It is important for you to establish care with a gynecologist to discuss your painful periods.  It is recommended that you look into birth control, such as oral contraception, Implanon, or IUD to see if this will help with your monthly cycles of pain and nausea.  Take medications as prescribed.    Dysmenorrhea Menstrual cramps (dysmenorrhea) are caused by the muscles of the uterus tightening (contracting) during a menstrual period. For some women, this discomfort is merely bothersome. For others, dysmenorrhea can be severe enough to interfere with everyday activities for a few days each month. Primary dysmenorrhea is menstrual cramps that last a couple of days when you start having menstrual periods or soon after. This often begins after a teenager starts having her period. As a woman gets older or has a baby, the cramps will usually lessen or disappear. Secondary dysmenorrhea begins later in life, lasts longer, and the pain may be stronger than primary dysmenorrhea. The pain may start before the period and last a few days after the period.  CAUSES  Dysmenorrhea is usually caused by an underlying problem, such as:  The tissue lining the uterus grows outside of the uterus in other areas of the body (endometriosis).  The endometrial tissue, which normally lines the uterus, is found in or grows into the muscular walls of the uterus (adenomyosis).  The pelvic blood vessels are engorged with blood just before the menstrual period (pelvic congestive syndrome).  Overgrowth of cells (polyps) in the lining of the uterus or cervix.  Falling down of the uterus (prolapse) because of loose or stretched ligaments.  Depression.  Bladder problems, infection, or inflammation.  Problems with the intestine, a tumor, or irritable bowel syndrome.  Cancer of the female organs or bladder.  A severely tipped uterus.  A very tight opening or closed cervix.  Noncancerous tumors of the uterus  (fibroids).  Pelvic inflammatory disease (PID).  Pelvic scarring (adhesions) from a previous surgery.  Ovarian cyst.  An intrauterine device (IUD) used for birth control. RISK FACTORS You may be at greater risk of dysmenorrhea if:  You are younger than age 32.  You started puberty early.  You have irregular or heavy bleeding.  You have never given birth.  You have a family history of this problem.  You are a smoker. SIGNS AND SYMPTOMS   Cramping or throbbing pain in your lower abdomen.  Headaches.  Lower back pain.  Nausea or vomiting.  Diarrhea.  Sweating or dizziness.  Loose stools. DIAGNOSIS  A diagnosis is based on your history, symptoms, physical exam, diagnostic tests, or procedures. Diagnostic tests or procedures may include:  Blood tests.  Ultrasonography.  An examination of the lining of the uterus (dilation and curettage, D&C).  An examination inside your abdomen or pelvis with a scope (laparoscopy).  X-rays.  CT scan.  MRI.  An examination inside the bladder with a scope (cystoscopy).  An examination inside the intestine or stomach with a scope (colonoscopy, gastroscopy). TREATMENT  Treatment depends on the cause of the dysmenorrhea. Treatment may include:  Pain medicine prescribed by your health care provider.  Birth control pills or an IUD with progesterone hormone in it.  Hormone replacement therapy.  Nonsteroidal anti-inflammatory drugs (NSAIDs). These may help stop the production of prostaglandins.  Surgery to remove adhesions, endometriosis, ovarian cyst, or fibroids.  Removal of the uterus (hysterectomy).  Progesterone shots to stop the menstrual period.  Cutting the nerves on the sacrum that go to the female organs (presacral neurectomy).  Electric current to the sacral nerves (sacral nerve stimulation).  Antidepressant medicine.  Psychiatric therapy, counseling, or group therapy.  Exercise and physical  therapy.  Meditation and yoga therapy.  Acupuncture. HOME CARE INSTRUCTIONS   Only take over-the-counter or prescription medicines as directed by your health care provider.  Place a heating pad or hot water bottle on your lower back or abdomen. Do not sleep with the heating pad.  Use aerobic exercises, walking, swimming, biking, and other exercises to help lessen the cramping.  Massage to the lower back or abdomen may help.  Stop smoking.  Avoid alcohol and caffeine. SEEK MEDICAL CARE IF:   Your pain does not get better with medicine.  You have pain with sexual intercourse.  Your pain increases and is not controlled with medicines.  You have abnormal vaginal bleeding with your period.  You develop nausea or vomiting with your period that is not controlled with medicine. SEEK IMMEDIATE MEDICAL CARE IF:  You pass out.  Document Released: 08/04/2005 Document Revised: 04/06/2013 Document Reviewed: 01/20/2013 [facility] Patient Information 2015 [place], [place]. This information is not intended to replace advice given to you by your health care provider. Make sure you discuss any questions you have with your health care provider.

## 2015-07-09 ENCOUNTER — Inpatient Hospital Stay (HOSPITAL_COMMUNITY)
Admission: AD | Admit: 2015-07-09 | Discharge: 2015-07-09 | Disposition: A | Discharge status: Home / Self Care | Payer: Medicaid Other | Source: Ambulatory Visit | Attending: Family Medicine | Admitting: Family Medicine

## 2015-07-09 ENCOUNTER — Inpatient Hospital Stay (HOSPITAL_COMMUNITY): Payer: Medicaid Other

## 2015-07-09 DIAGNOSIS — O26899 Other specified pregnancy related conditions, unspecified trimester: Secondary | ICD-10-CM

## 2015-07-09 DIAGNOSIS — F17210 Nicotine dependence, cigarettes, uncomplicated: Secondary | ICD-10-CM | POA: Diagnosis not present

## 2015-07-09 DIAGNOSIS — Z3A01 Less than 8 weeks gestation of pregnancy: Secondary | ICD-10-CM | POA: Diagnosis not present

## 2015-07-09 DIAGNOSIS — Z3491 Encounter for supervision of normal pregnancy, unspecified, first trimester: Secondary | ICD-10-CM

## 2015-07-09 DIAGNOSIS — O26891 Other specified pregnancy related conditions, first trimester: Secondary | ICD-10-CM | POA: Diagnosis not present

## 2015-07-09 DIAGNOSIS — O9989 Other specified diseases and conditions complicating pregnancy, childbirth and the puerperium: Secondary | ICD-10-CM

## 2015-07-09 DIAGNOSIS — R109 Unspecified abdominal pain: Secondary | ICD-10-CM | POA: Insufficient documentation

## 2015-07-09 DIAGNOSIS — R197 Diarrhea, unspecified: Secondary | ICD-10-CM

## 2015-07-09 DIAGNOSIS — O99331 Smoking (tobacco) complicating pregnancy, first trimester: Secondary | ICD-10-CM | POA: Diagnosis not present

## 2015-07-09 LAB — CBC WITH DIFFERENTIAL/PLATELET
BASOS PCT: 1 %
Basophils Absolute: 0 10*3/uL (ref 0.0–0.1)
EOS ABS: 0.1 10*3/uL (ref 0.0–0.7)
Eosinophils Relative: 2 %
HEMATOCRIT: 33.7 % — AB (ref 36.0–46.0)
HEMOGLOBIN: 10.7 g/dL — AB (ref 12.0–15.0)
Lymphocytes Relative: 44 %
Lymphs Abs: 2.8 10*3/uL (ref 0.7–4.0)
MCH: 25.2 pg — AB (ref 26.0–34.0)
MCHC: 31.8 g/dL (ref 30.0–36.0)
MCV: 79.3 fL (ref 78.0–100.0)
MONO ABS: 0.5 10*3/uL (ref 0.1–1.0)
MONOS PCT: 8 %
NEUTROS PCT: 45 %
Neutro Abs: 2.8 10*3/uL (ref 1.7–7.7)
Platelets: 340 10*3/uL (ref 150–400)
RBC: 4.25 MIL/uL (ref 3.87–5.11)
RDW: 15.3 % (ref 11.5–15.5)
WBC: 6.2 10*3/uL (ref 4.0–10.5)

## 2015-07-09 LAB — WET PREP, GENITAL
CLUE CELLS WET PREP: NONE SEEN
Sperm: NONE SEEN
TRICH WET PREP: NONE SEEN
YEAST WET PREP: NONE SEEN

## 2015-07-09 LAB — URINALYSIS, ROUTINE W REFLEX MICROSCOPIC
Bilirubin Urine: NEGATIVE
Glucose, UA: NEGATIVE mg/dL
Hgb urine dipstick: NEGATIVE
Ketones, ur: NEGATIVE mg/dL
Leukocytes, UA: NEGATIVE
NITRITE: NEGATIVE
PH: 5.5 (ref 5.0–8.0)
Protein, ur: NEGATIVE mg/dL

## 2015-07-09 LAB — POCT PREGNANCY, URINE: PREG TEST UR: POSITIVE — AB

## 2015-07-09 NOTE — MAU Provider Note (Signed)
History     CSN: [id number]  Arrival date and time: 07/09/15 1727   None     Chief Complaint  Patient presents with  . Abdominal Cramping   HPI Erica [name] is 24 y.o. presents for "really bad cramping".  By LMP she is 7831w1d gestation.  The pain began during last night.  Had 2 episodes of diarrhea.  Describes as shooting lower abdominal pains.   Neg for vaginal bleeding.  She had +HPT 10 days ago and was [facility].     Past Medical History  Diagnosis Date  . Anemia     Past Surgical History  Procedure Laterality Date  . Multiple tooth extractions      No family history on file.  Social History  Substance Use Topics  . Smoking status: Current Every Day Smoker -- 0.50 packs/day    Types: Cigarettes  . Smokeless tobacco: Never Used  . Alcohol Use: Yes     Comment: occ    Allergies: No Known Allergies  Prescriptions prior to admission  Medication Sig Dispense Refill Last Dose  . Prenatal Vit-Min-FA-Fish Oil ([facility] PRENATAL GUMMY) 0.4-113.5 MG CHEW Chew 2 tablets by mouth daily.   07/08/2015 at Unknown time  . naproxen (NAPROSYN) 500 MG tablet Take 1 tablet (500 mg total) by mouth 2 (two) times daily. For pain (Patient not taking: Reported on 07/09/2015) 30 tablet 0   . ondansetron (ZOFRAN ODT) 8 MG disintegrating tablet Take 1 tablet (8 mg total) by mouth every 8 (eight) hours as needed for nausea or vomiting. (Patient not taking: Reported on 07/09/2015) 20 tablet 0 Not Taking at Unknown time  . promethazine (PHENERGAN) 25 MG suppository Place 1 suppository (25 mg total) rectally every 6 (six) hours as needed for nausea or vomiting. (Patient not taking: Reported on 07/09/2015) 12 suppository 0 Not Taking at Unknown time  . promethazine (PHENERGAN) 25 MG tablet Take 1 tablet (25 mg total) by mouth every 6 (six) hours as needed for nausea or vomiting. (Patient not taking: Reported on 07/09/2015) 20 tablet 0 Not Taking at Unknown time  . traMADol  (ULTRAM) 50 MG tablet Take 1 tablet (50 mg total) by mouth every 6 (six) hours as needed for moderate pain or severe pain. (Patient not taking: Reported on 07/09/2015) 15 tablet 0 Not Taking at Unknown time    Review of Systems  Constitutional: Negative for fever and chills.  Gastrointestinal: Positive for abdominal pain and diarrhea. Negative for nausea, vomiting and constipation.  Genitourinary: Negative for dysuria, urgency, frequency and hematuria.       Neg for vaginal bleeding  Neurological: Negative for headaches.   Physical Exam   Blood pressure 115/71, pulse 84, temperature 98.9 F (37.2 C), temperature source Oral, resp. rate 18, height 5' 4.5" (1.638 m), weight 109 lb (49.442 kg), last menstrual period 05/13/2015.  Physical Exam  Nursing note and vitals reviewed. Constitutional: She is oriented to person, place, and time. She appears well-developed and well-nourished.  HENT:  Head: Normocephalic.  Neck: Normal range of motion. Neck supple.  Cardiovascular: Normal rate.   Respiratory: Effort normal and breath sounds normal. No respiratory distress.  GI: Soft. She exhibits no distension. There is no tenderness. There is no rebound and no guarding.  Genitourinary: There is no rash, tenderness or lesion on the right labia. There is no rash, tenderness or lesion on the left labia. Uterus is enlarged (slightly.  Uterus is anteflexed). Uterus is not tender. Cervix exhibits no motion tenderness,  no discharge and no friability. Right adnexum displays no mass, no tenderness and no fullness. Left adnexum displays no mass, no tenderness and no fullness. No erythema or bleeding in the vagina. Vaginal discharge (small amount of white discharge without odor) found.  Musculoskeletal: Normal range of motion. She exhibits no edema.  Neurological: She is alert and oriented to person, place, and time.  Skin: Skin is warm and dry.   Results for orders placed or performed during the hospital  encounter of 07/09/15 (from the past 24 hour(s))  Urinalysis, Routine w reflex microscopic (not at [facility])     Status: Abnormal   Collection Time: 07/09/15  6:25 PM  Result Value Ref Range   Color, Urine YELLOW YELLOW   APPearance CLEAR CLEAR   Specific Gravity, Urine >1.030 (H) 1.005 - 1.030   pH 5.5 5.0 - 8.0   Glucose, UA NEGATIVE NEGATIVE mg/dL   Hgb urine dipstick NEGATIVE NEGATIVE   Bilirubin Urine NEGATIVE NEGATIVE   Ketones, ur NEGATIVE NEGATIVE mg/dL   Protein, ur NEGATIVE NEGATIVE mg/dL   Nitrite NEGATIVE NEGATIVE   Leukocytes, UA NEGATIVE NEGATIVE  Pregnancy, urine POC     Status: Abnormal   Collection Time: 07/09/15  6:35 PM  Result Value Ref Range   Preg Test, Ur POSITIVE (A) NEGATIVE  CBC with Differential/Platelet     Status: Abnormal   Collection Time: 07/09/15  7:34 PM  Result Value Ref Range   WBC 6.2 4.0 - 10.5 K/uL   RBC 4.25 3.87 - 5.11 MIL/uL   Hemoglobin 10.7 (L) 12.0 - 15.0 g/dL   HCT 16.1 (L) 09.6 - 04.5 %   MCV 79.3 78.0 - 100.0 fL   MCH 25.2 (L) 26.0 - 34.0 pg   MCHC 31.8 30.0 - 36.0 g/dL   RDW 40.9 81.1 - 91.4 %   Platelets 340 150 - 400 K/uL   Neutrophils Relative % 45 %   Neutro Abs 2.8 1.7 - 7.7 K/uL   Lymphocytes Relative 44 %   Lymphs Abs 2.8 0.7 - 4.0 K/uL   Monocytes Relative 8 %   Monocytes Absolute 0.5 0.1 - 1.0 K/uL   Eosinophils Relative 2 %   Eosinophils Absolute 0.1 0.0 - 0.7 K/uL   Basophils Relative 1 %   Basophils Absolute 0.0 0.0 - 0.1 K/uL  Wet prep, genital     Status: Abnormal   Collection Time: 07/09/15  7:47 PM  Result Value Ref Range   Yeast Wet Prep HPF POC NONE SEEN NONE SEEN   Trich, Wet Prep NONE SEEN NONE SEEN   Clue Cells Wet Prep HPF POC NONE SEEN NONE SEEN   WBC, Wet Prep HPF POC FEW (A) NONE SEEN   Sperm NONE SEEN        CLINICAL DATA: 25 year old female reportedly 8 weeks 1 day pregnant with cramping for the past several months  EXAM: OBSTETRIC <14 WK [place] AND TRANSVAGINAL OB US  TECHNIQUE: Both  transabdominal and transvaginal ultrasound examinations were performed for complete evaluation of the gestation as well as the maternal uterus, adnexal regions, and pelvic cul-de-sac. Transvaginal technique was performed to assess early pregnancy.  COMPARISON: None.  FINDINGS: Intrauterine gestational sac: A single intrauterine gestational sac is visualized.  Yolk sac: Present  Embryo: Not visible  Cardiac Activity: Not applicable  Heart Rate: Not applicable  MSD: 9.8 mm 5 w 5 d  [place] EDC: March 05, 2016  Maternal uterus/adnexae: Normal appearance of both ovaries. The corpus luteal cyst is located on  the left. No subchorionic hemorrhage. Trace free fluid likely physiologic.  IMPRESSION: Early intrauterine gestational sac, and yolk sac but no fetal pole, or cardiac activity yet visualized. Recommend follow-up quantitative B-HCG levels and follow-up US in 14 days to confirm and assess viability. This recommendation follows SRU consensus guidelines: Diagnostic Criteria for Nonviable Pregnancy Early in the First Trimester. Malva Limes Med 2013; 045:4098-11.  By mean sac diameter, the estimated gestational age is 5 weeks 5 days.   Electronically Signed  By: Malachy Moan M.D.  On: 07/09/2015 20:24      Result History     MAU Course  Procedures  MDM MSE Lab Exam U/S Results reviewed with the patient.  Assessment and Plan  A:  Abdominal pain in early pregnancy--by U/S [redacted]w[redacted]d gestation with YS      Size less than dates      Diarrhea  P:  Begin prenatal care --patient states she sister goes to [facility] and she will call them.        Begin prenatal vitamins  Kamira Mellette,EVE M 07/09/2015, 8:49 PM

## 2015-07-09 NOTE — MAU Note (Signed)
Having really really bad cramps in lower abd. No bleeding. Has not been seen yet with ths preg.  +HPT about 1-2 wks ago[facility]

## 2015-07-09 NOTE — Discharge Instructions (Signed)
First Trimester of Pregnancy The first trimester of pregnancy is from week 1 until the end of week 12 (months 1 through 3). A week after a sperm fertilizes an egg, the egg will implant on the wall of the uterus. This embryo will begin to develop into a baby. Genes from you and your partner are forming the baby. The female genes determine whether the baby is a boy or a girl. At 6-8 weeks, the eyes and face are formed, and the heartbeat can be seen on ultrasound. At the end of 12 weeks, all the baby's organs are formed.  Now that you are pregnant, you will want to do everything you can to have a healthy baby. Two of the most important things are to get good prenatal care and to follow your health care provider's instructions. Prenatal care is all the medical care you receive before the baby's birth. This care will help prevent, find, and treat any problems during the pregnancy and childbirth. BODY CHANGES Your body goes through many changes during pregnancy. The changes vary from woman to woman.   You may gain or lose a couple of pounds at first.  You may feel sick to your stomach (nauseous) and throw up (vomit). If the vomiting is uncontrollable, call your health care provider.  You may tire easily.  You may develop headaches that can be relieved by medicines approved by your health care provider.  You may urinate more often. Painful urination may mean you have a bladder infection.  You may develop heartburn as a result of your pregnancy.  You may develop constipation because certain hormones are causing the muscles that push waste through your intestines to slow down.  You may develop hemorrhoids or swollen, bulging veins (varicose veins).  Your breasts may begin to grow larger and become tender. Your nipples may stick out more, and the tissue that surrounds them (areola) may become darker.  Your gums may bleed and may be sensitive to brushing and flossing.  Dark spots or blotches (chloasma,  mask of pregnancy) may develop on your face. This will likely fade after the baby is born.  Your menstrual periods will stop.  You may have a loss of appetite.  You may develop cravings for certain kinds of food.  You may have changes in your emotions from day to day, such as being excited to be pregnant or being concerned that something may go wrong with the pregnancy and baby.  You may have more vivid and strange dreams.  You may have changes in your hair. These can include thickening of your hair, rapid growth, and changes in texture. Some women also have hair loss during or after pregnancy, or hair that feels dry or thin. Your hair will most likely return to normal after your baby is born. WHAT TO EXPECT AT YOUR PRENATAL VISITS During a routine prenatal visit:  You will be weighed to make sure you and the baby are growing normally.  Your blood pressure will be taken.  Your abdomen will be measured to track your baby's growth.  The fetal heartbeat will be listened to starting around week 10 or 12 of your pregnancy.  Test results from any previous visits will be discussed. Your health care provider may ask you:  How you are feeling.  If you are feeling the baby move.  If you have had any abnormal symptoms, such as leaking fluid, bleeding, severe headaches, or abdominal cramping.  If you are using any tobacco products,   including cigarettes, chewing tobacco, and electronic cigarettes.  If you have any questions. Other tests that may be performed during your first trimester include:  Blood tests to find your blood type and to check for the presence of any previous infections. They will also be used to check for low iron levels (anemia) and Rh antibodies. Later in the pregnancy, blood tests for diabetes will be done along with other tests if problems develop.  Urine tests to check for infections, diabetes, or protein in the urine.  An ultrasound to confirm the proper growth  and development of the baby.  An amniocentesis to check for possible genetic problems.  Fetal screens for spina bifida and Down syndrome.  You may need other tests to make sure you and the baby are doing well.  HIV (human immunodeficiency virus) testing. Routine prenatal testing includes screening for HIV, unless you choose not to have this test. HOME CARE INSTRUCTIONS  Medicines  Follow your health care provider's instructions regarding medicine use. Specific medicines may be either safe or unsafe to take during pregnancy.  Take your prenatal vitamins as directed.  If you develop constipation, try taking a stool softener if your health care provider approves. Diet  Eat regular, well-balanced meals. Choose a variety of foods, such as meat or vegetable-based protein, fish, milk and low-fat dairy products, vegetables, fruits, and whole grain breads and cereals. Your health care provider will help you determine the amount of weight gain that is right for you.  Avoid raw meat and uncooked cheese. These carry germs that can cause birth defects in the baby.  Eating four or five small meals rather than three large meals a day may help relieve nausea and vomiting. If you start to feel nauseous, eating a few soda crackers can be helpful. Drinking liquids between meals instead of during meals also seems to help nausea and vomiting.  If you develop constipation, eat more high-fiber foods, such as fresh vegetables or fruit and whole grains. Drink enough fluids to keep your urine clear or pale yellow. Activity and Exercise  Exercise only as directed by your health care provider. Exercising will help you:  Control your weight.  Stay in shape.  Be prepared for labor and delivery.  Experiencing pain or cramping in the lower abdomen or low back is a good sign that you should stop exercising. Check with your health care provider before continuing normal exercises.  Try to avoid standing for long  periods of time. Move your legs often if you must stand in one place for a long time.  Avoid heavy lifting.  Wear low-heeled shoes, and practice good posture.  You may continue to have sex unless your health care provider directs you otherwise. Relief of Pain or Discomfort  Wear a good support bra for breast tenderness.   Take warm sitz baths to soothe any pain or discomfort caused by hemorrhoids. Use hemorrhoid cream if your health care provider approves.   Rest with your legs elevated if you have leg cramps or low back pain.  If you develop varicose veins in your legs, wear support hose. Elevate your feet for 15 minutes, 3-4 times a day. Limit salt in your diet. Prenatal Care  Schedule your prenatal visits by the twelfth week of pregnancy. They are usually scheduled monthly at first, then more often in the last 2 months before delivery.  Write down your questions. Take them to your prenatal visits.  Keep all your prenatal visits as directed by your   health care provider. Safety  Wear your seat belt at all times when driving.  Make a list of emergency phone numbers, including numbers for family, friends, the hospital, and police and fire departments. General Tips  Ask your health care provider for a referral to a local prenatal education class. Begin classes no later than at the beginning of month 6 of your pregnancy.  Ask for help if you have counseling or nutritional needs during pregnancy. Your health care provider can offer advice or refer you to specialists for help with various needs.  Do not use hot tubs, steam rooms, or saunas.  Do not douche or use tampons or scented sanitary pads.  Do not cross your legs for long periods of time.  Avoid cat litter boxes and soil used by cats. These carry germs that can cause birth defects in the baby and possibly loss of the fetus by miscarriage or stillbirth.  Avoid all smoking, herbs, alcohol, and medicines not prescribed by  your health care provider. Chemicals in these affect the formation and growth of the baby.  Do not use any tobacco products, including cigarettes, chewing tobacco, and electronic cigarettes. If you need help quitting, ask your health care provider. You may receive counseling support and other resources to help you quit.  Schedule a dentist appointment. At home, brush your teeth with a soft toothbrush and be gentle when you floss. SEEK MEDICAL CARE IF:   You have dizziness.  You have mild pelvic cramps, pelvic pressure, or nagging pain in the abdominal area.  You have persistent nausea, vomiting, or diarrhea.  You have a bad smelling vaginal discharge.  You have pain with urination.  You notice increased swelling in your face, hands, legs, or ankles. SEEK IMMEDIATE MEDICAL CARE IF:   You have a fever.  You are leaking fluid from your vagina.  You have spotting or bleeding from your vagina.  You have severe abdominal cramping or pain.  You have rapid weight gain or loss.  You vomit blood or material that looks like coffee grounds.  You are exposed to German measles and have never had them.  You are exposed to fifth disease or chickenpox.  You develop a severe headache.  You have shortness of breath.  You have any kind of trauma, such as from a fall or a car accident.   This information is not intended to replace advice given to you by your health care provider. Make sure you discuss any questions you have with your health care provider.   Document Released: 07/29/2001 Document Revised: 08/25/2014 Document Reviewed: 06/14/2013 Elsevier Interactive Patient Education 2016 Elsevier Inc.  

## 2015-07-10 LAB — HIV ANTIBODY (ROUTINE TESTING W REFLEX): HIV Screen 4th Generation wRfx: NONREACTIVE

## 2015-07-11 LAB — GC/CHLAMYDIA PROBE AMP (~~LOC~~) NOT AT ARMC
Chlamydia: NEGATIVE
NEISSERIA GONORRHEA: NEGATIVE

## 2015-07-16 LAB — OB RESULTS CONSOLE HEPATITIS B SURFACE ANTIGEN: HEP B S AG: NEGATIVE

## 2015-07-16 LAB — OB RESULTS CONSOLE GC/CHLAMYDIA: Gonorrhea: NEGATIVE

## 2015-07-16 LAB — OB RESULTS CONSOLE RUBELLA ANTIBODY, IGM: Rubella: IMMUNE

## 2015-07-24 ENCOUNTER — Encounter (HOSPITAL_COMMUNITY): Payer: Self-pay | Admitting: *Deleted

## 2015-07-24 ENCOUNTER — Inpatient Hospital Stay (HOSPITAL_COMMUNITY)
Admission: AD | Admit: 2015-07-24 | Discharge: 2015-07-24 | Disposition: A | Discharge status: Home / Self Care | Payer: Medicaid Other | Source: Ambulatory Visit | Attending: Obstetrics and Gynecology | Admitting: Obstetrics and Gynecology

## 2015-07-24 DIAGNOSIS — O210 Mild hyperemesis gravidarum: Secondary | ICD-10-CM | POA: Diagnosis not present

## 2015-07-24 DIAGNOSIS — F17210 Nicotine dependence, cigarettes, uncomplicated: Secondary | ICD-10-CM | POA: Diagnosis not present

## 2015-07-24 DIAGNOSIS — Z3A01 Less than 8 weeks gestation of pregnancy: Secondary | ICD-10-CM | POA: Diagnosis not present

## 2015-07-24 DIAGNOSIS — O99331 Smoking (tobacco) complicating pregnancy, first trimester: Secondary | ICD-10-CM | POA: Diagnosis not present

## 2015-07-24 DIAGNOSIS — O219 Vomiting of pregnancy, unspecified: Secondary | ICD-10-CM

## 2015-07-24 LAB — URINALYSIS, ROUTINE W REFLEX MICROSCOPIC
Bilirubin Urine: NEGATIVE
Glucose, UA: NEGATIVE mg/dL
HGB URINE DIPSTICK: NEGATIVE
KETONES UR: NEGATIVE mg/dL
LEUKOCYTES UA: NEGATIVE
Nitrite: NEGATIVE
PROTEIN: NEGATIVE mg/dL
Specific Gravity, Urine: 1.02 (ref 1.005–1.030)
pH: 8.5 — ABNORMAL HIGH (ref 5.0–8.0)

## 2015-07-24 MED ORDER — ONDANSETRON 4 MG PO TBDP
4.0000 mg | ORAL_TABLET | Freq: Once | ORAL | Status: AC
  Administered 2015-07-24: 4 mg via ORAL
  Filled 2015-07-24: qty 1

## 2015-07-24 MED ORDER — ONDANSETRON 4 MG PO TBDP: 4.0000 mg | ORAL_TABLET | Freq: Three times a day (TID) | ORAL | Status: DC | PRN

## 2015-07-24 NOTE — Discharge Instructions (Signed)

## 2015-07-24 NOTE — MAU Note (Signed)
Pt sent from [facility], vomiting since last week, noted blood in emesis today.  Denies pain or vaginal bleeding.

## 2015-07-24 NOTE — MAU Provider Note (Signed)
  History     24 yo, G1P0, 7.6wks presents to the MAU after calling the office this morning. Reports vomiting since last week and noted some blood in her emesis today.  Pt  Able to keep some food and water down as she was eating chips and drinking water while waiting for the provider to evaluate. Denies pain or vaginal bleeding.   Has an upcoming NOB appointment scheduled for 07/30/15 at [facility].     There are no active problems to display for this patient.   Chief Complaint  Patient presents with  . Emesis During Pregnancy   HPI  OB History    Gravida Para Term Preterm AB TAB SAB Ectopic Multiple Living   1               Past Medical History  Diagnosis Date  . Anemia     Past Surgical History  Procedure Laterality Date  . Multiple tooth extractions      History reviewed. No pertinent family history.  Social History  Substance Use Topics  . Smoking status: Current Every Day Smoker -- 0.50 packs/day    Types: Cigarettes  . Smokeless tobacco: Never Used  . Alcohol Use: Yes     Comment: occ    Allergies: No Known Allergies  Prescriptions prior to admission  Medication Sig Dispense Refill Last Dose  . Prenatal Multivit-Min-Fe-FA (PRENATAL/IRON) TABS Take 1 tablet by mouth daily.   07/23/2015 at Unknown time    ROS Physical Exam   Blood pressure 98/59, pulse 61, temperature 98.6 F (37 C), resp. rate 18, height 5' 4.5" (1.638 m), weight 49.215 kg (108 lb 8 oz), last menstrual period 05/13/2015.   Results for orders placed or performed during the hospital encounter of 07/24/15 (from the past 24 hour(s))  Urinalysis, Routine w reflex microscopic (not [facility])     Status: Abnormal   Collection Time: 07/24/15 12:13 PM  Result Value Ref Range   Color, Urine YELLOW YELLOW   APPearance HAZY (A) CLEAR   Specific Gravity, Urine 1.020 1.005 - 1.030   pH 8.5 (H) 5.0 - 8.0   Glucose, UA NEGATIVE NEGATIVE mg/dL   Hgb urine dipstick NEGATIVE NEGATIVE   Bilirubin Urine  NEGATIVE NEGATIVE   Ketones, ur NEGATIVE NEGATIVE mg/dL   Protein, ur NEGATIVE NEGATIVE mg/dL   Nitrite NEGATIVE NEGATIVE   Leukocytes, UA NEGATIVE NEGATIVE      Physical Exam  ED Course  Assessment:  IUP 7.6 wks N&V in first trimester UA negative No Ketones noted  Plan: Encourage to eat and drink small amounts throughout day Rx Zofran ODT Follow up with NOB scheduled at [facility] on 07/30/15  Call if symptom do not improve, or worsen   Alphonzo Severanceachel Annalie Wenner CNM, MSN 07/24/2015 1:49 PM

## 2015-08-19 NOTE — L&D Delivery Note (Signed)
Progressed from 5 cm to 10 cm within 10 minutes, reporting strong desire to push. Moderate variables and late decels noted, likely due to rapid progression and descent. With good pushing efforts, delivery occurred as follows:  Delivery Note At 1:53 AM a viable female "[name]" was delivered via Vaginal, Spontaneous Delivery (Presentation: OA restituting to LOA).  APGARS: , ; weight  .   Placenta status: Intact, Spontaneous Schultz. Placenta immediately delivered without forewarning - unable to collect cord blood. Cord: 3 vessels with the following complications: Cord around neck, infant somersaulted through w/o event. Cord pH: NA  Anesthesia: Epidural  Episiotomy: None Lacerations: Skid marks Suture Repair: NA Est. Blood Loss (mL): 350  Mom to postpartum.  Baby to Couplet care / Skin to Skin.  Mom plans to breastfeed.  Elects Mirena IUD for contraception.  Erica [name], Erica [name] 02/12/2016, 2:28 AM

## 2016-01-08 IMAGING — US US OB COMP LESS 14 WK
1 series · 15 of 28 positions shown · non-contrast
Comparison: None.

CLINICAL DATA: 24-year-old female reportedly 8 weeks 1 day pregnant
with cramping for the past several months

EXAM:
OBSTETRIC <14 WK US AND TRANSVAGINAL OB US
TECHNIQUE: Both transabdominal and transvaginal ultrasound examinations were
performed for complete evaluation of the gestation as well as the
maternal uterus, adnexal regions, and pelvic cul-de-sac.
Transvaginal technique was performed to assess early pregnancy.

[Series 1: us ob comp less 14 wk · 15 of 63 slices shown]
[im 1/63]
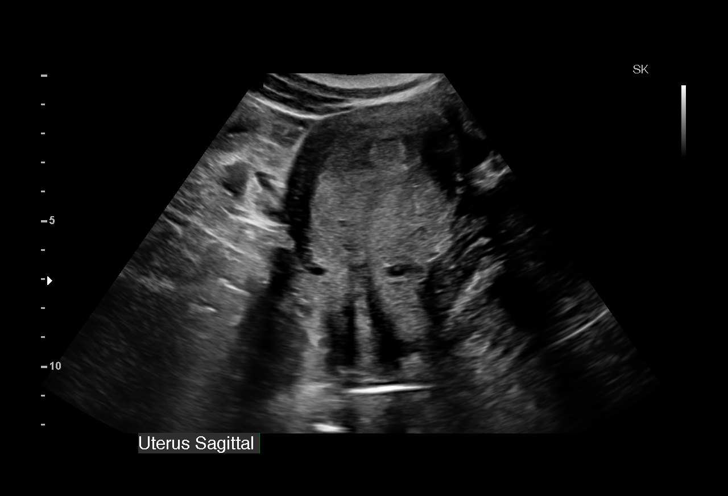
[im 5/63]
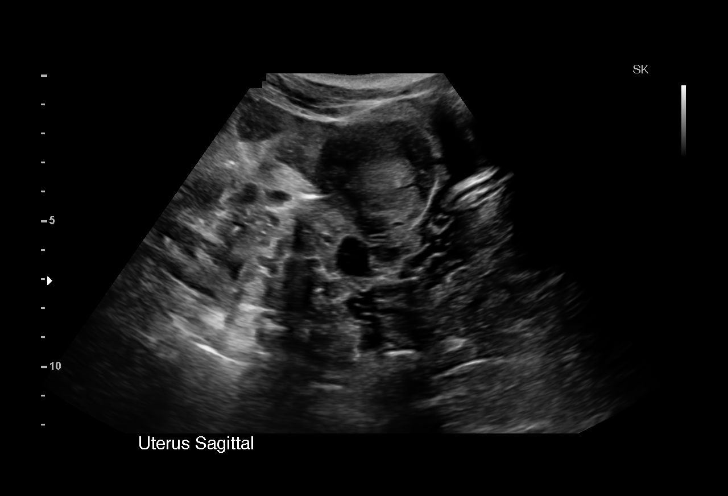
[im 10/63]
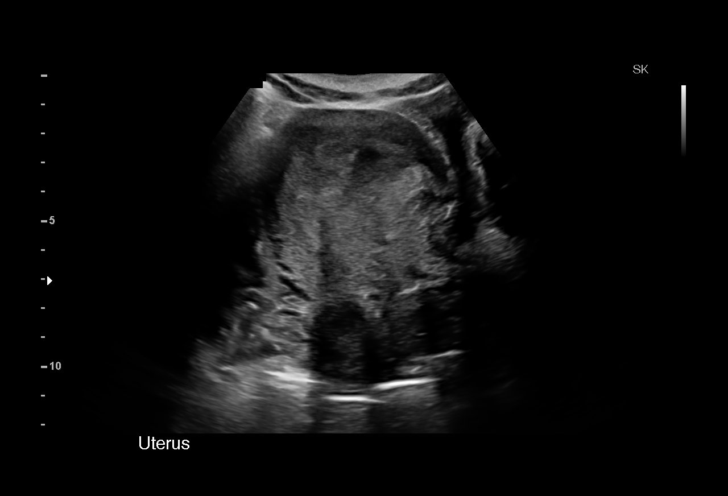
[im 14/63]
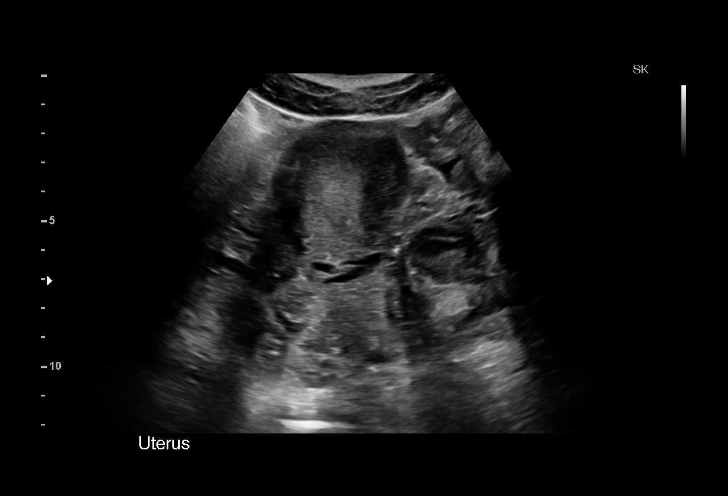
[im 19/63]
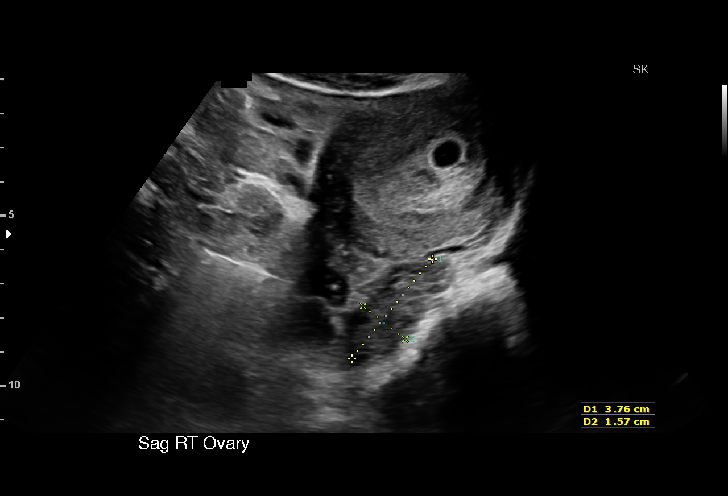
[im 23/63]
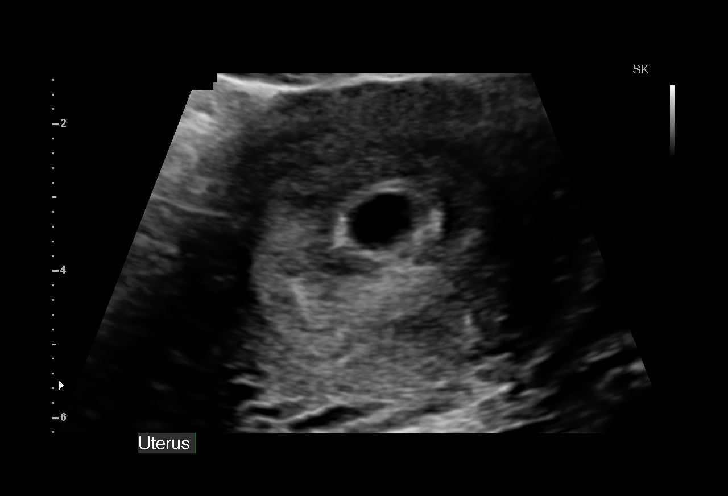
[im 28/63]
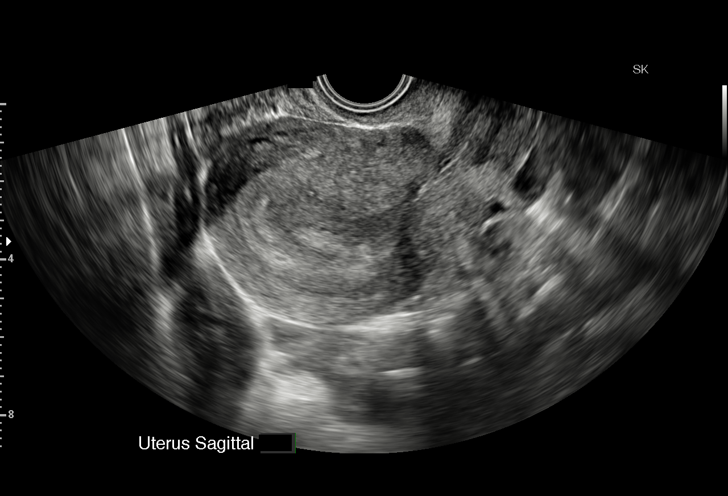
[im 33/63]
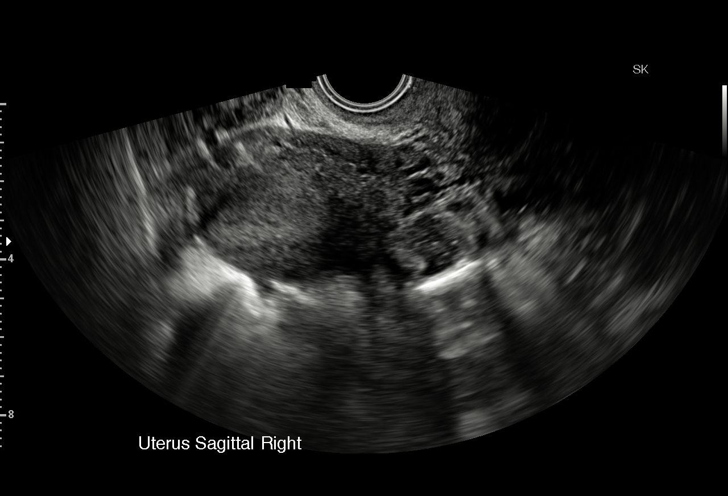
[im 35/63]
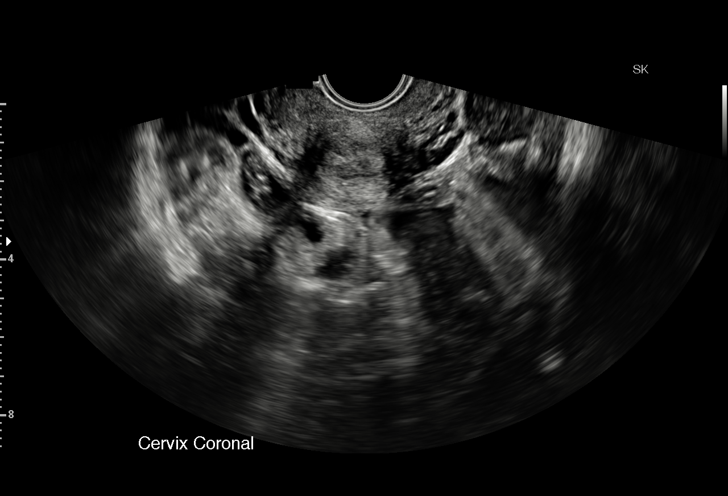
[im 40/63]
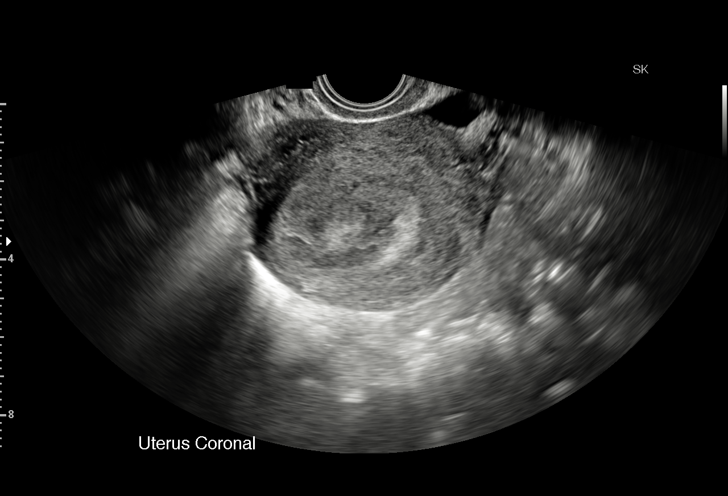
[im 44/63]
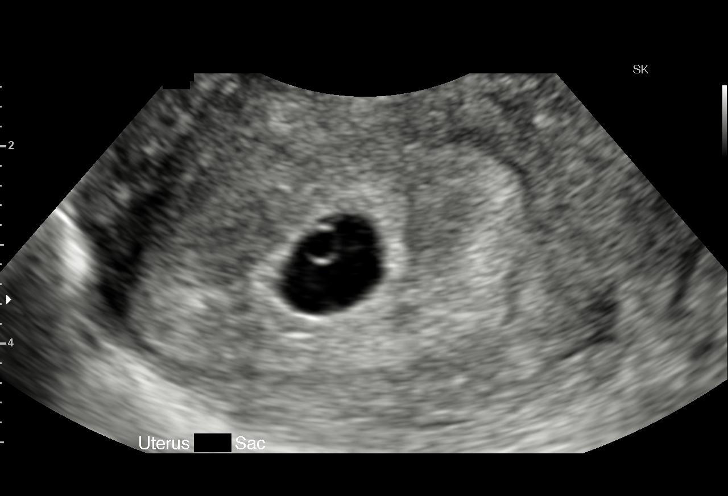
[im 49/63]
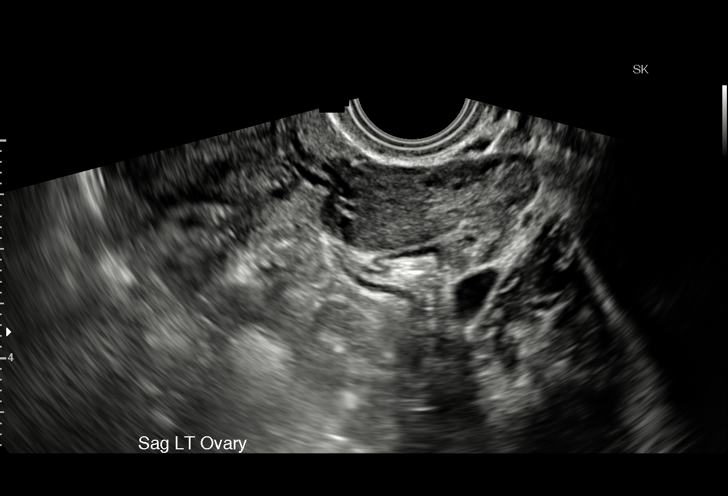
[im 53/63]
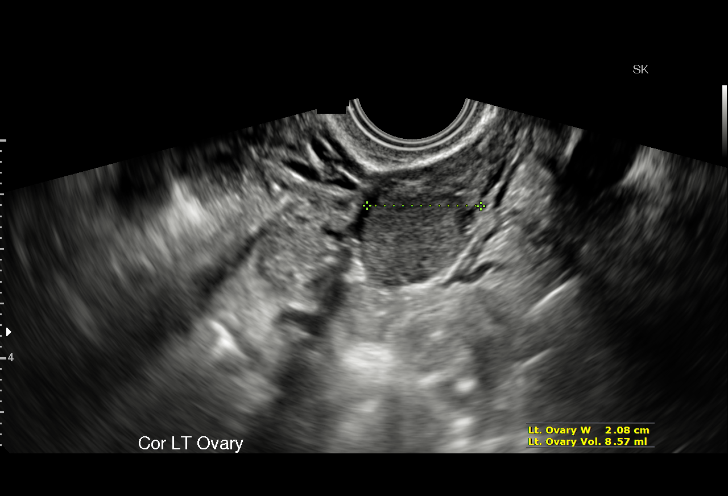
[im 58/63]
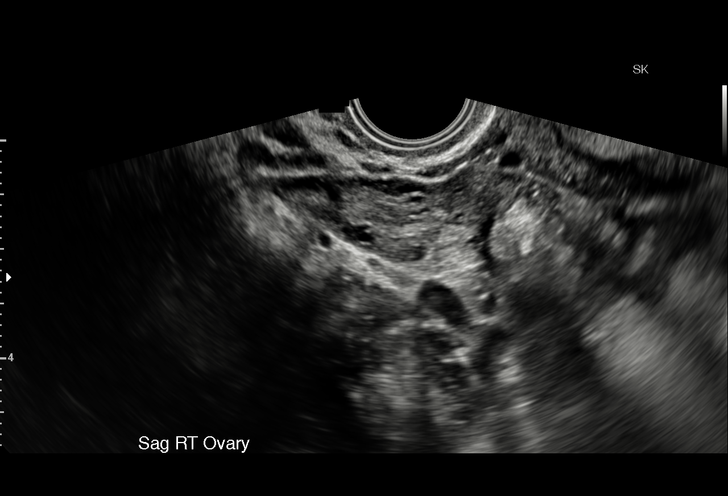
[im 63/63]
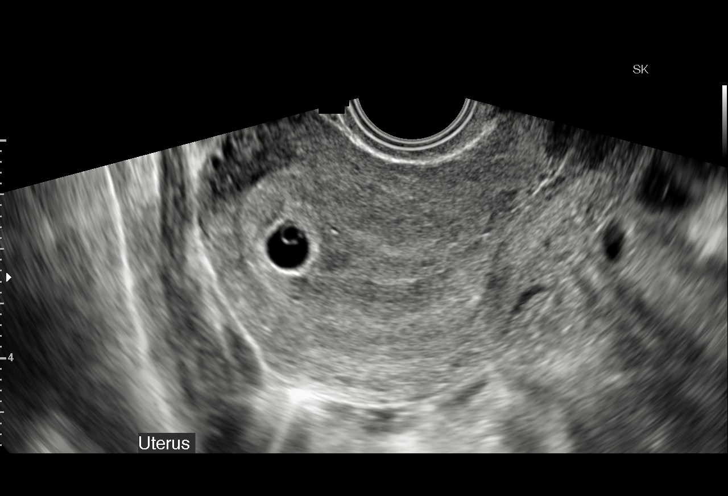

[15 of 28 positions shown; findings below may reference images not displayed]

FINDINGS: Intrauterine gestational sac: A single intrauterine gestational sac
is visualized.

Yolk sac:  Present

Embryo:  Not visible

Cardiac Activity: Not applicable

Heart Rate: Not applicable

MSD: 9.8  mm   5 w   5  d

US EDC: March 05, 2016

Maternal uterus/adnexae: Normal appearance of both ovaries. The
corpus luteal cyst is located on the left. No subchorionic
hemorrhage. Trace free fluid likely physiologic.
IMPRESSION: Early intrauterine gestational sac, and yolk sac but no fetal pole,
or cardiac activity yet visualized. Recommend follow-up quantitative
B-HCG levels and follow-up US in 14 days to confirm and assess
viability. This recommendation follows SRU consensus guidelines:
Diagnostic Criteria for Nonviable Pregnancy Early in the First
Trimester. N Engl J Med 8279; [DATE].

By mean sac diameter, the estimated gestational age is 5 weeks 5
days.

## 2016-02-01 LAB — OB RESULTS CONSOLE GBS: STREP GROUP B AG: NEGATIVE

## 2016-02-11 ENCOUNTER — Encounter (HOSPITAL_COMMUNITY): Payer: Self-pay | Admitting: *Deleted

## 2016-02-11 ENCOUNTER — Inpatient Hospital Stay (HOSPITAL_COMMUNITY)
Admission: AD | Admit: 2016-02-11 | Discharge: 2016-02-11 | Disposition: A | Discharge status: Home / Self Care | Payer: Medicaid Other | Source: Ambulatory Visit | Attending: Obstetrics and Gynecology | Admitting: Obstetrics and Gynecology

## 2016-02-11 ENCOUNTER — Inpatient Hospital Stay (HOSPITAL_COMMUNITY)
Admission: AD | Admit: 2016-02-11 | Discharge: 2016-02-14 | DRG: 775 | Disposition: A | Discharge status: Home / Self Care | Payer: Medicaid Other | Source: Ambulatory Visit | Attending: Obstetrics and Gynecology | Admitting: Obstetrics and Gynecology

## 2016-02-11 DIAGNOSIS — D649 Anemia, unspecified: Secondary | ICD-10-CM | POA: Diagnosis present

## 2016-02-11 DIAGNOSIS — O9902 Anemia complicating childbirth: Secondary | ICD-10-CM | POA: Diagnosis present

## 2016-02-11 DIAGNOSIS — O99019 Anemia complicating pregnancy, unspecified trimester: Secondary | ICD-10-CM

## 2016-02-11 DIAGNOSIS — Z87891 Personal history of nicotine dependence: Secondary | ICD-10-CM | POA: Diagnosis not present

## 2016-02-11 DIAGNOSIS — Z3A36 36 weeks gestation of pregnancy: Secondary | ICD-10-CM

## 2016-02-11 HISTORY — DX: Chlamydial infection, unspecified: A74.9

## 2016-02-11 HISTORY — DX: Trichomoniasis, unspecified: A59.9

## 2016-02-11 LAB — CBC
HEMATOCRIT: 34.6 % — AB (ref 36.0–46.0)
Hemoglobin: 11.1 g/dL — ABNORMAL LOW (ref 12.0–15.0)
MCH: 26 pg (ref 26.0–34.0)
MCHC: 32.1 g/dL (ref 30.0–36.0)
MCV: 81 fL (ref 78.0–100.0)
PLATELETS: 280 10*3/uL (ref 150–400)
RBC: 4.27 MIL/uL (ref 3.87–5.11)
RDW: 18.1 % — AB (ref 11.5–15.5)
WBC: 6.3 10*3/uL (ref 4.0–10.5)

## 2016-02-11 LAB — TYPE AND SCREEN
ABO/RH(D): O POS
Antibody Screen: NEGATIVE

## 2016-02-11 MED ORDER — EPHEDRINE 5 MG/ML INJ
10.0000 mg | INTRAVENOUS | Status: DC | PRN
  Filled 2016-02-11: qty 2

## 2016-02-11 MED ORDER — ONDANSETRON HCL 4 MG/2ML IJ SOLN
4.0000 mg | Freq: Four times a day (QID) | INTRAMUSCULAR | Status: DC | PRN
  Filled 2016-02-11: qty 2

## 2016-02-11 MED ORDER — OXYCODONE-ACETAMINOPHEN 5-325 MG PO TABS
2.0000 | ORAL_TABLET | ORAL | Status: DC | PRN
  Administered 2016-02-13: 2 via ORAL
  Filled 2016-02-11 (×2): qty 2

## 2016-02-11 MED ORDER — FENTANYL CITRATE (PF) 100 MCG/2ML IJ SOLN
50.0000 ug | INTRAMUSCULAR | Status: DC | PRN
Start: 2016-02-11 — End: 2016-02-15
  Administered 2016-02-11: 50 ug via INTRAVENOUS
  Filled 2016-02-11: qty 2

## 2016-02-11 MED ORDER — BETAMETHASONE SOD PHOS & ACET 6 (3-3) MG/ML IJ SUSP
12.0000 mg | Freq: Once | INTRAMUSCULAR | Status: AC
  Administered 2016-02-12: 12 mg via INTRAMUSCULAR
  Filled 2016-02-11: qty 2

## 2016-02-11 MED ORDER — SOD CITRATE-CITRIC ACID 500-334 MG/5ML PO SOLN: 30.0000 mL | ORAL | Status: DC | PRN

## 2016-02-11 MED ORDER — DIPHENHYDRAMINE HCL 50 MG/ML IJ SOLN: 12.5000 mg | INTRAMUSCULAR | Status: DC | PRN

## 2016-02-11 MED ORDER — FLEET ENEMA 7-19 GM/118ML RE ENEM: 1.0000 | ENEMA | RECTAL | Status: DC | PRN

## 2016-02-11 MED ORDER — PHENYLEPHRINE 40 MCG/ML (10ML) SYRINGE FOR IV PUSH (FOR BLOOD PRESSURE SUPPORT)
80.0000 ug | PREFILLED_SYRINGE | INTRAVENOUS | Status: DC | PRN
  Filled 2016-02-11: qty 10
  Filled 2016-02-11: qty 5

## 2016-02-11 MED ORDER — LACTATED RINGERS IV SOLN
INTRAVENOUS | Status: DC
  Administered 2016-02-11: 22:00:00 via INTRAVENOUS

## 2016-02-11 MED ORDER — LIDOCAINE HCL (PF) 1 % IJ SOLN
30.0000 mL | INTRAMUSCULAR | Status: DC | PRN
  Filled 2016-02-11: qty 30

## 2016-02-11 MED ORDER — PHENYLEPHRINE 40 MCG/ML (10ML) SYRINGE FOR IV PUSH (FOR BLOOD PRESSURE SUPPORT)
80.0000 ug | PREFILLED_SYRINGE | INTRAVENOUS | Status: DC | PRN
  Filled 2016-02-11: qty 5

## 2016-02-11 MED ORDER — OXYTOCIN 40 UNITS IN LACTATED RINGERS INFUSION - SIMPLE MED
2.5000 [IU]/h | INTRAVENOUS | Status: DC
Start: 2016-02-11 — End: 2016-02-15
  Filled 2016-02-11: qty 1000

## 2016-02-11 MED ORDER — OXYTOCIN BOLUS FROM INFUSION
500.0000 mL | INTRAVENOUS | Status: DC
Start: 2016-02-11 — End: 2016-02-15
  Administered 2016-02-12: 500 mL via INTRAVENOUS

## 2016-02-11 MED ORDER — OXYCODONE-ACETAMINOPHEN 5-325 MG PO TABS
1.0000 | ORAL_TABLET | ORAL | Status: DC | PRN
  Filled 2016-02-11: qty 1

## 2016-02-11 MED ORDER — LACTATED RINGERS IV SOLN: 500.0000 mL | INTRAVENOUS | Status: DC | PRN

## 2016-02-11 MED ORDER — LACTATED RINGERS IV SOLN
500.0000 mL | Freq: Once | INTRAVENOUS | Status: AC
  Administered 2016-02-11: 500 mL via INTRAVENOUS

## 2016-02-11 MED ORDER — FENTANYL 2.5 MCG/ML BUPIVACAINE 1/10 % EPIDURAL INFUSION (WH - ANES)
14.0000 mL/h | INTRAMUSCULAR | Status: DC | PRN
  Administered 2016-02-12: 14 mL/h via EPIDURAL
  Filled 2016-02-11: qty 125

## 2016-02-11 MED ORDER — ACETAMINOPHEN 325 MG PO TABS: 650.0000 mg | ORAL_TABLET | ORAL | Status: DC | PRN

## 2016-02-11 NOTE — Anesthesia Preprocedure Evaluation (Signed)
Anesthesia Evaluation  Patient identified by MRN, date of birth, ID band Patient awake    Reviewed: Allergy & Precautions, NPO status , Patient's Chart, lab work & pertinent test results  Airway Mallampati: II  TM Distance: >3 FB Neck ROM: Full    Dental no notable dental hx.    Pulmonary former smoker,    Pulmonary exam normal breath sounds clear to auscultation       Cardiovascular negative cardio ROS Normal cardiovascular exam Rhythm:Regular Rate:Normal     Neuro/Psych negative neurological ROS  negative psych ROS   GI/Hepatic negative GI ROS, Neg liver ROS,   Endo/Other  negative endocrine ROS  Renal/GU negative Renal ROS  negative genitourinary   Musculoskeletal negative musculoskeletal ROS (+)   Abdominal   Peds negative pediatric ROS (+)  Hematology  (+) anemia ,   Anesthesia Other Findings   Reproductive/Obstetrics negative OB ROS                             Anesthesia Physical Anesthesia Plan  ASA: II  Anesthesia Plan: Epidural   Post-op Pain Management:    Induction: Intravenous  Airway Management Planned: Natural Airway  Additional Equipment:   Intra-op Plan:   Post-operative Plan:   Informed Consent: I have reviewed the patients History and Physical, chart, labs and discussed the procedure including the risks, benefits and alternatives for the proposed anesthesia with the patient or authorized representative who has indicated his/her understanding and acceptance.   Dental advisory given  Plan Discussed with: CRNA  Anesthesia Plan Comments: (Informed consent obtained prior to proceeding including risk of failure, 1% risk of PDPH, risk of minor discomfort and bruising.  Discussed rare but serious complications including epidural abscess, permanent nerve injury, epidural hematoma.  Discussed alternatives to epidural analgesia and patient desires to proceed.   Timeout performed pre-procedure verifying patient name, procedure, and platelet count.  Patient tolerated procedure well. )        Anesthesia Quick Evaluation

## 2016-02-11 NOTE — MAU Note (Signed)
Pt seen @ [facility] this morning, SVE 2 cm's, was sent to MAU for further evaluation of labor.  Denies bleeding or LOF.  Contractions started @ 2330 last night.

## 2016-02-11 NOTE — Discharge Instructions (Signed)
Braxton Hicks Contractions °Contractions of the uterus can occur throughout pregnancy. Contractions are not always a sign that you are in labor.  °WHAT ARE BRAXTON HICKS CONTRACTIONS?  °Contractions that occur before labor are called Braxton Hicks contractions, or false labor. Toward the end of pregnancy (32-34 weeks), these contractions can develop more often and may become more forceful. This is not true labor because these contractions do not result in opening (dilatation) and thinning of the cervix. They are sometimes difficult to tell apart from true labor because these contractions can be forceful and people have different pain tolerances. You should not feel embarrassed if you go to the hospital with false labor. Sometimes, the only way to tell if you are in true labor is for your health care provider to look for changes in the cervix. °If there are no prenatal problems or other health problems associated with the pregnancy, it is completely safe to be sent home with false labor and await the onset of true labor. °HOW CAN YOU TELL THE DIFFERENCE BETWEEN TRUE AND FALSE LABOR? °False Labor °· The contractions of false labor are usually shorter and not as hard as those of true labor.   °· The contractions are usually irregular.   °· The contractions are often felt in the front of the lower abdomen and in the groin.   °· The contractions may go away when you walk around or change positions while lying down.   °· The contractions get weaker and are shorter lasting as time goes on.   °· The contractions do not usually become progressively stronger, regular, and closer together as with true labor.   °True Labor °· Contractions in true labor last 30-70 seconds, become very regular, usually become more intense, and increase in frequency.   °· The contractions do not go away with walking.   °· The discomfort is usually felt in the top of the uterus and spreads to the lower abdomen and low back.   °· True labor can be  determined by your health care provider with an exam. This will show that the cervix is dilating and getting thinner.   °WHAT TO REMEMBER °· Keep up with your usual exercises and follow other instructions given by your health care provider.   °· Take medicines as directed by your health care provider.   °· Keep your regular prenatal appointments.   °· Eat and drink lightly if you think you are going into labor.   °· If Braxton Hicks contractions are making you uncomfortable:   °¨ Change your position from lying down or resting to walking, or from walking to resting.   °¨ Sit and rest in a tub of warm water.   °¨ Drink 2-3 glasses of water. Dehydration may cause these contractions.   °¨ Do slow and deep breathing several times an hour.   °WHEN SHOULD I SEEK IMMEDIATE MEDICAL CARE? °Seek immediate medical care if: °· Your contractions become stronger, more regular, and closer together.   °· You have fluid leaking or gushing from your vagina.   °· You have a fever.   °· You pass blood-tinged mucus.   °· You have vaginal bleeding.   °· You have continuous abdominal pain.   °· You have low back pain that you never had before.   °· You feel your baby's head pushing down and causing pelvic pressure.   °· Your baby is not moving as much as it used to.   °  °This information is not intended to replace advice given to you by your health care provider. Make sure you discuss any questions you have with your health care   provider. °  °Document Released: 08/04/2005 Document Revised: 08/09/2013 Document Reviewed: 05/16/2013 °Elsevier Interactive Patient Education ©2016 [employer] ° °

## 2016-02-11 NOTE — MAU Note (Signed)
C/o ucs since 2330 last night; denies vaginal spotting or vagnal discharge; sent from OB's office to MAU;

## 2016-02-11 NOTE — MAU Note (Signed)
PT  SAYS HURT BAD   AT  6PM.    VE IN OFFICE    AND MAU   TODAY   LOOSE  1  CM.      DENIES      HSV AND MRSA.   GBS-  NEG.

## 2016-02-11 NOTE — H&P (Signed)
Erica [name] is a 25 y.o. female G1P0 at 36.5 weeks EGApresenting w/ ctxs of increased intensity and duration since earlier today. Was seen both in office and MAU today for same, and cvx was noted to be 1/70/-2. Confirmed EDD 03/05/16 by LMP, consistent with first trimester Ultrasound dating. Endorses FM. Denies VB or LOF.  Pregnancy followed at [facility] since 6+5weeks and remarkable for:  1. Anemia  History  OB History    Gravida Para Term Preterm AB TAB SAB Ectopic Multiple Living   1              Past Medical History  Diagnosis Date  . Anemia   . Chlamydia   . Trichomonas infection    Past Surgical History  Procedure Laterality Date  . Multiple tooth extractions     Family History: family history is negative for Alcohol abuse, Arthritis, Asthma, Birth defects, Cancer, COPD, Depression, Diabetes, Early death, Drug abuse, Hearing loss, Heart disease, Hyperlipidemia, Hypertension, Kidney disease, Learning disabilities, Mental illness, Mental retardation, Miscarriages / Stillbirths, Stroke, Vision loss, and Varicose Veins. Social History:  reports that she has quit smoking. Her smoking use included Cigarettes. She smoked 0.50 packs per day. She has never used smokeless tobacco. She reports that she does not drink alcohol or use illicit drugs.   Prenatal Transfer Tool  Maternal Diabetes: No Genetic Screening: Normal - low risk Panorama Maternal Ultrasounds/Referrals: Normal Fetal Ultrasounds or other Referrals:  None Maternal Substance Abuse:  No Significant Maternal Medications:  Meds include: Other: Fusion plus, Zofran, PNV Significant Maternal Lab Results:  Lab values include: Group B Strep negative, Other: Hemoglobin 10.2 @ 28 wks Other Comments:  Tdap 01/16/16  ROS 10 Systems reviewed and are negative for acute change except as noted in the HPI.   FHR: BL 130 w/ moderate variability, +accels, no decels Toco: q 2-3 min, palpate strong  Dilation: 3 Effacement (%):  80 Station: -2 Exam by:: K. [email or web address]@ 2130 Blood pressure 119/81, pulse 79, temperature 98.3 F (36.8 C), temperature source Oral, resp. rate 18, height 5\' 4"  (1.626 m), weight 67.359 kg (148 lb 8 oz), last menstrual period 05/13/2015.  Maternal Exam:  Pelvis: adequate for delivery.     EFW: 5 3/4 lbs    Physical Exam   General Appearance: Alert, appropriate appearance for age. Crying with each ctx HEENT Exam: Grossly normal Chest/Respiratory Exam: Normal chest wall and respirations. Clear to auscultation  Cardiovascular Exam: Regular rate and rhythm. S1, S2, no murmur Gastrointestinal Exam: Gravid w/ size compatible w/ GA Pelvic Exam: As above. Cephalic presentation by both Leopold's and VE  Psychiatric Exam: Alert and oriented, appropriate affect  Prenatal labs: ABO, Rh: O+ Antibody: Neg Rubella: Immune RPR: NR HBsAg: Neg HIV: Non Reactive (11/21 1934)  GBS: Neg (02/01/16)  Results for orders placed or performed during the hospital encounter of 02/11/16 (from the past 24 hour(s))  CBC     Status: Abnormal   Collection Time: 02/11/16 10:10 PM  Result Value Ref Range   WBC 6.3 4.0 - 10.5 K/uL   RBC 4.27 3.87 - 5.11 MIL/uL   Hemoglobin 11.1 (L) 12.0 - 15.0 g/dL   HCT 13.034.6 (L) 86.536.0 - 78.446.0 %   MCV 81.0 78.0 - 100.0 fL   MCH 26.0 26.0 - 34.0 pg   MCHC 32.1 30.0 - 36.0 g/dL   RDW 69.618.1 (H) 29.511.5 - 28.415.5 %   Platelets 280 150 - 400 K/uL  Type and screen [facility]  Status: None   Collection Time: 02/11/16 10:10 PM  Result Value Ref Range   ABO/RH(D) O POS    Antibody Screen NEG    Sample Expiration 02/14/2016     Assessment/Plan: 25 yo G1P0 @ 36.5 wks admitted in PTL. Dr. Richardson Doppole made aware Routine [facility] orders BMZ Pain med/epidural prn Cat 1 FHRT Anemia Expect SVD    Erica ScarletWILLIAMS, Erica Mickelson 02/11/2016, 10:53 PM

## 2016-02-12 ENCOUNTER — Inpatient Hospital Stay (HOSPITAL_COMMUNITY): Payer: Medicaid Other | Admitting: Anesthesiology

## 2016-02-12 ENCOUNTER — Encounter (HOSPITAL_COMMUNITY): Payer: Self-pay

## 2016-02-12 DIAGNOSIS — O99019 Anemia complicating pregnancy, unspecified trimester: Secondary | ICD-10-CM

## 2016-02-12 LAB — ABO/RH: ABO/RH(D): O POS

## 2016-02-12 LAB — RPR: RPR Ser Ql: NONREACTIVE

## 2016-02-12 MED ORDER — WITCH HAZEL-GLYCERIN EX PADS: 1.0000 "application " | MEDICATED_PAD | CUTANEOUS | Status: DC | PRN

## 2016-02-12 MED ORDER — ONDANSETRON HCL 4 MG PO TABS: 4.0000 mg | ORAL_TABLET | ORAL | Status: DC | PRN

## 2016-02-12 MED ORDER — DIBUCAINE 1 % RE OINT
1.0000 "application " | TOPICAL_OINTMENT | RECTAL | Status: DC | PRN
  Filled 2016-02-12: qty 28.4

## 2016-02-12 MED ORDER — ZOLPIDEM TARTRATE 5 MG PO TABS: 5.0000 mg | ORAL_TABLET | Freq: Every evening | ORAL | Status: DC | PRN

## 2016-02-12 MED ORDER — LACTATED RINGERS IV SOLN: 500.0000 mL | Freq: Once | INTRAVENOUS | Status: DC

## 2016-02-12 MED ORDER — IBUPROFEN 600 MG PO TABS
600.0000 mg | ORAL_TABLET | Freq: Four times a day (QID) | ORAL | Status: DC
  Administered 2016-02-12 – 2016-02-14 (×9): 600 mg via ORAL
  Filled 2016-02-12 (×12): qty 1

## 2016-02-12 MED ORDER — SIMETHICONE 80 MG PO CHEW: 80.0000 mg | CHEWABLE_TABLET | ORAL | Status: DC | PRN

## 2016-02-12 MED ORDER — BENZOCAINE-MENTHOL 20-0.5 % EX AERO
1.0000 "application " | INHALATION_SPRAY | CUTANEOUS | Status: DC | PRN
  Filled 2016-02-12: qty 56

## 2016-02-12 MED ORDER — METHYLERGONOVINE MALEATE 0.2 MG PO TABS: 0.2000 mg | ORAL_TABLET | ORAL | Status: DC | PRN

## 2016-02-12 MED ORDER — LACTATED RINGERS IV SOLN
INTRAVENOUS | Status: DC
  Administered 2016-02-12: 05:00:00 via INTRAUTERINE

## 2016-02-12 MED ORDER — OXYCODONE-ACETAMINOPHEN 5-325 MG PO TABS
2.0000 | ORAL_TABLET | ORAL | Status: DC | PRN
  Administered 2016-02-12: 2 via ORAL
  Filled 2016-02-12: qty 2

## 2016-02-12 MED ORDER — LIDOCAINE HCL (PF) 1 % IJ SOLN
INTRAMUSCULAR | Status: DC | PRN
  Administered 2016-02-12 (×2): 6 mL

## 2016-02-12 MED ORDER — ONDANSETRON HCL 4 MG/2ML IJ SOLN
4.0000 mg | INTRAMUSCULAR | Status: DC | PRN
  Administered 2016-02-12: 4 mg via INTRAVENOUS

## 2016-02-12 MED ORDER — METHYLERGONOVINE MALEATE 0.2 MG/ML IJ SOLN: 0.2000 mg | INTRAMUSCULAR | Status: DC | PRN

## 2016-02-12 MED ORDER — FERROUS SULFATE 325 (65 FE) MG PO TABS
325.0000 mg | ORAL_TABLET | Freq: Two times a day (BID) | ORAL | Status: DC
  Administered 2016-02-12 – 2016-02-14 (×4): 325 mg via ORAL
  Filled 2016-02-12 (×7): qty 1

## 2016-02-12 MED ORDER — ACETAMINOPHEN 325 MG PO TABS: 650.0000 mg | ORAL_TABLET | ORAL | Status: DC | PRN

## 2016-02-12 MED ORDER — OXYCODONE-ACETAMINOPHEN 5-325 MG PO TABS
1.0000 | ORAL_TABLET | ORAL | Status: DC | PRN
  Administered 2016-02-12: 1 via ORAL
  Filled 2016-02-12: qty 1

## 2016-02-12 MED ORDER — COCONUT OIL OIL
1.0000 "application " | TOPICAL_OIL | Status: DC | PRN
  Filled 2016-02-12: qty 120

## 2016-02-12 MED ORDER — PRENATAL MULTIVITAMIN CH
1.0000 | ORAL_TABLET | Freq: Every day | ORAL | Status: DC
  Administered 2016-02-12 – 2016-02-14 (×2): 1 via ORAL
  Filled 2016-02-12 (×3): qty 1

## 2016-02-12 MED ORDER — DIPHENHYDRAMINE HCL 25 MG PO CAPS: 25.0000 mg | ORAL_CAPSULE | Freq: Four times a day (QID) | ORAL | Status: DC | PRN

## 2016-02-12 MED ORDER — SENNOSIDES-DOCUSATE SODIUM 8.6-50 MG PO TABS
2.0000 | ORAL_TABLET | ORAL | Status: DC
  Administered 2016-02-13: 2 via ORAL
  Filled 2016-02-12 (×2): qty 2

## 2016-02-12 NOTE — Lactation Note (Signed)
This note was copied from a baby's chart. Lactation Consultation Note  Initial visit made.  Breastfeeding consultation services and support information given and reviewed.  Mom has the Care of Your Late Preterm Baby handout.  Reviewed volume parameters for supplementation.  Baby is latching per mom.  Mom was started pumping and obtained drops. Instructed mom to give any expressed milk back to baby.  Instructed to put baby to breast with any feeding cue, post pump x 15 minutes and supplement per volume parameters.  Encouraged to call for assist/concerns prn.  Patient Name: Erica Genevie AnnDestynee Kilpatrick UJWJX'BToday's Date: 02/12/2016 Reason for consult: Initial assessment;Infant < 6lbs;Late preterm infant   Maternal Data    Feeding Feeding Type: Breast Fed Length of feed: 10 min  LATCH Score/Interventions                      Lactation Tools Discussed/Used     Consult Status Consult Status: Follow-up Date: 02/13/16 Follow-up type: In-patient    Huston FoleyMOULDEN, Loraine Freid S 02/12/2016, 3:05 PM

## 2016-02-12 NOTE — Anesthesia Procedure Notes (Signed)
Epidural Patient location during procedure: OB  Staffing Anesthesiologist: Sherrian DiversENENNY, Mixtli Reno  Preanesthetic Checklist Completed: patient identified, site marked, surgical consent, pre-op evaluation, timeout performed, IV checked, risks and benefits discussed and monitors and equipment checked  Epidural Patient position: sitting Prep: DuraPrep Patient monitoring: blood pressure and heart rate Approach: midline Location: L3-L4 Injection technique: LOR saline  Needle:  Needle type: Tuohy  Needle gauge: 17 G Needle length: 9 cm Needle insertion depth: 4.5 cm Catheter type: closed end flexible Catheter size: 19 Gauge Catheter at skin depth: 12 cm Test dose: negative and Other  Assessment Events: blood not aspirated, injection not painful, no injection resistance, negative IV test and no paresthesia  Additional Notes Reason for block:procedure for pain

## 2016-02-12 NOTE — Anesthesia Postprocedure Evaluation (Signed)
Anesthesia Post Note  Patient: Erica [name]  Procedure(s) Performed: * No procedures listed *  Patient location during evaluation: Mother Baby Anesthesia Type: Epidural Level of consciousness: awake, awake and alert and oriented Pain management: pain level controlled Vital Signs Assessment: post-procedure vital signs reviewed and stable Respiratory status: spontaneous breathing, nonlabored ventilation and respiratory function stable Cardiovascular status: stable Postop Assessment: no headache, no backache, patient able to bend at knees, no signs of nausea or vomiting and adequate PO intake Anesthetic complications: no     Last Vitals:  Filed Vitals:   02/12/16 0445 02/12/16 0900  BP: 111/72 108/63  Pulse: 53 61  Temp: 36.5 C 36.6 C  Resp: 18 20    Last Pain:  Filed Vitals:   02/12/16 0923  PainSc: 2    Pain Goal: Patients Stated Pain Goal: 4 (02/12/16 0445)               Truitt LeepHYMER,Francile Woolford

## 2016-02-12 NOTE — Progress Notes (Signed)
  Subjective: Comfortable w/ epidural for the most part. Has hot spot on left side. Received one epidural PCA dose. +FM. Denies VB or LOF.  Objective: BP 104/71 mmHg  Pulse 50  Temp(Src) 98.3 F (36.8 C) (Oral)  Resp 18  Ht 5\' 4"  (1.626 m)  Wt 67.359 kg (148 lb 8 oz)  BMI 25.48 kg/m2  SpO2 100%  LMP 05/13/2015 Today's Vitals   02/12/16 0046 02/12/16 0050 02/12/16 0051 02/12/16 0056  BP: 99/68 105/62  104/71  Pulse: 55 54 59 50  Temp:      TempSrc:      Resp: 18 18  18   Height:      Weight:      SpO2:  100%    PainSc:        FHT: BL 135 w/ moderate variability, repetitive lates due to long period of supine position -- recovered w/ position change, variables noted as well. +Accels and scalp stim UC:   irregular, every 3-4 minutes SVE: 5/80/0 @ 0121 BMZ given at 0033  Assessment:  IUP at 36.6 wks PTL Approaching active labor; rapid descent Cat 2 FHRT, yet overall reassuring GBS neg  Plan: Amniotomy Full internals Amnioinfusion Continue intrauterine resuscitative measures Consult as indicated Expect progress and SVD  Sherre ScarletWILLIAMS, Layna Roeper CNM 02/12/2016, 1:15 AM

## 2016-02-13 LAB — CBC
HEMATOCRIT: 28.3 % — AB (ref 36.0–46.0)
Hemoglobin: 8.9 g/dL — ABNORMAL LOW (ref 12.0–15.0)
MCH: 25.5 pg — ABNORMAL LOW (ref 26.0–34.0)
MCHC: 31.4 g/dL (ref 30.0–36.0)
MCV: 81.1 fL (ref 78.0–100.0)
Platelets: 216 10*3/uL (ref 150–400)
RBC: 3.49 MIL/uL — ABNORMAL LOW (ref 3.87–5.11)
RDW: 18.4 % — ABNORMAL HIGH (ref 11.5–15.5)
WBC: 14 10*3/uL — ABNORMAL HIGH (ref 4.0–10.5)

## 2016-02-13 NOTE — Progress Notes (Signed)
Mom encouraged to place baby skin to skin as often as possible.  

## 2016-02-13 NOTE — Lactation Note (Signed)
This note was copied from a baby's chart. Lactation Consultation Note  Mom is following the plan for late preterm baby.  She is putting baby to breast, post pumping, and supplementing with bottle.  Instructed to increase supplement to 10-20 mls today.  Mom obtaining a few mls with pumping.  Encouraged to call with concerns or assist.  Patient Name: Erica [name] ZOXWR'UToday's Date: 02/13/2016 Reason for consult: Follow-up assessment   Maternal Data    Feeding    LATCH Score/Interventions                      Lactation Tools Discussed/Used     Consult Status Consult Status: Follow-up Date: 02/14/16 Follow-up type: In-patient    Huston FoleyMOULDEN, Other Atienza S 02/13/2016, 11:29 AM

## 2016-02-13 NOTE — Lactation Note (Signed)
This note was copied from a baby's chart. Lactation Consultation Note  Follow up assist.  Mom attempting to latch baby in cradle hold and baby having difficulty sustaining latch.  Assisted with cross cradle hold and breast compression.  Baby latched well.  She becomes sleepy easily at breast but responds to stimulation.  Mom's breasts are filling.  Reminded to continue with pumping every 3 hours.  Patient Name: Erica [name] WUJWJ'XToday's Date: 02/13/2016 Reason for consult: Follow-up assessment;Late preterm infant;Infant < 6lbs   Maternal Data    Feeding    LATCH Score/Interventions Latch: Grasps breast easily, tongue down, lips flanged, rhythmical sucking. Intervention(s): Teach feeding cues;Waking techniques Intervention(s): Adjust position;Assist with latch;Breast massage;Breast compression  Audible Swallowing: Spontaneous and intermittent  Type of Nipple: Everted at rest and after stimulation  Comfort (Breast/Nipple): Soft / non-tender     Hold (Positioning): Assistance needed to correctly position infant at breast and maintain latch. Intervention(s): Breastfeeding basics reviewed;Support Pillows;Position options  LATCH Score: 9  Lactation Tools Discussed/Used     Consult Status Consult Status: Follow-up Date: 02/14/16 Follow-up type: In-patient    Huston FoleyMOULDEN, Destany Severns S 02/13/2016, 2:11 PM

## 2016-02-13 NOTE — Progress Notes (Signed)
Assumed care of mom and baby.  Family at bedside. 

## 2016-02-13 NOTE — Progress Notes (Signed)
Post Partum Day 1  Subjective: no complaints and tolerating PO  Objective: Blood pressure 114/70, pulse 60, temperature 97.9 F (36.6 C), temperature source Oral, resp. rate 18, height 5\' 4"  (1.626 m), weight 148 lb 8 oz (67.359 kg), last menstrual period 05/13/2015, SpO2 100 %, unknown if currently breastfeeding.  Physical Exam:  General: alert and no distress Lochia: appropriate Uterine Fundus: firm Incision: NA DVT Evaluation: No evidence of DVT seen on physical exam.   Recent Labs  02/11/16 2210 02/13/16 0616  HGB 11.1* 8.9*  HCT 34.6* 28.3*    Assessment/Plan: Plan for discharge tomorrow, Breastfeeding, Lactation consult and Contraception Mirena IUD   LOS: 2 days   Erica [name] V 02/13/2016, 11:05 AM

## 2016-02-13 NOTE — Progress Notes (Signed)
Assumed care of mom and baby.  Mom holding baby.  Family at bedside. 

## 2016-02-14 MED ORDER — IBUPROFEN 600 MG PO TABS: 600.0000 mg | ORAL_TABLET | Freq: Four times a day (QID) | ORAL | Status: DC | PRN

## 2016-02-14 NOTE — Lactation Note (Signed)
This note was copied from a baby's chart. Lactation Consultation Note: Infant is 36-6 weeks. Infant has an 8 % weight loss. Mother is breastfeeding and supplementing. Mothers milk is in . She has l20 ml of EBM at the bedside. Mother last gave 20 ml of ebm. Mother was observed independently latching infant on the (R) breast. Mother taught breast compression. Infant sustained latch . Observed strong suckling and audible swallows for 20-25 mins. Mother to offer infant EBM from bottle.  Mother is active with WIC. Mother was given a harmony hand pump with instructions. [facility]. She was given pump paper work.  Mother has good understanding of feeding and supplementing her Late preterm infant.   Patient Name: Erica [name]: 02/14/2016 Reason for consult: Follow-up assessment   Maternal Data    Feeding Feeding Type: Breast Fed Nipple Type: Slow - flow Length of feed: 25 min  LATCH Score/Interventions Latch: Grasps breast easily, tongue down, lips flanged, rhythmical sucking. Intervention(s): Skin to skin;Teach feeding cues;Waking techniques Intervention(s): Adjust position;Breast massage;Breast compression  Audible Swallowing: Spontaneous and intermittent  Type of Nipple: Everted at rest and after stimulation Intervention(s): Hand pump;Double electric pump  Comfort (Breast/Nipple): Filling, red/small blisters or bruises, mild/mod discomfort  Problem noted: Filling Interventions (Filling): Firm support  Hold (Positioning): Assistance needed to correctly position infant at breast and maintain latch. Intervention(s): Support Pillows;Position options  LATCH Score: 8  Lactation Tools Discussed/Used     Consult Status Consult Status: Follow-up [name]: 02/15/16 Follow-up type: In-patient    Stevan BornKendrick, Kilian Schwartz [facility] 02/14/2016, 12:20 PM

## 2016-02-14 NOTE — Discharge Summary (Signed)
Obstetric Discharge Summary Reason for Admission: labor Prenatal Procedures: ultrasound Intrapartum Procedures: spontaneous vaginal delivery Postpartum Procedures: none Complications-Operative and Postpartum: none HEMOGLOBIN  Date Value Ref Range Status  02/13/2016 8.9* 12.0 - 15.0 g/dL Final   HCT  Date Value Ref Range Status  02/13/2016 28.3* 36.0 - 46.0 % Final    Physical Exam:  General: alert and no distress Lochia: appropriate Uterine Fundus: firm Incision: n/a DVT Evaluation: No evidence of DVT seen on physical exam.  Discharge Diagnoses: Preterm Delivery 36wks  Discharge Information: Date: 02/14/2016 Activity: pelvic rest Diet: routine Medications: Ibuprofen Condition: stable Instructions: refer to practice specific booklet Discharge to: home   Newborn Data: Live born female  Birth Weight: 5 lb 15.1 oz (2695 g) APGAR: 8, 9  Home with mother.  Jade Burright Y 02/14/2016, 9:56 AM

## 2016-02-14 NOTE — Lactation Note (Signed)
This note was copied from a baby's chart. Lactation Consultation Note Mom sleeping, baby on DPT. Gave LPI information sheet. BF well, supplementing after BF. [facility] Encouraged to supplement as ordered from MD, mom has Similac. Patient Name: Girl Genevie AnnDestynee Sergent ZOXWR'UToday's Date: 02/14/2016 Reason for consult: Follow-up assessment;Infant weight loss   Maternal Data    Feeding Feeding Type: Breast Fed Length of feed: 30 min  LATCH Score/Interventions                      Lactation Tools Discussed/Used     Consult Status Consult Status: Follow-up Date: 02/14/16 Follow-up type: In-patient (in pm)    Dannell Raczkowski G 02/14/2016, 2:24 AM

## 2016-02-15 ENCOUNTER — Ambulatory Visit: Payer: Self-pay

## 2016-02-15 NOTE — Lactation Note (Signed)
This note was copied from a baby's chart. Lactation Consultation Note  Observed mom with baby to breast in cross cradle hold.  Baby is actively nursing with audible swallows.  Mom is pumping large amounts and giving some expressed milk back to baby but also formula.  I asked mom why she was giving formula and she said she wants to do both.  [facility]  [facility]  Discharge instructions done and questions answered.  Patient Name: Erica [name] ZOXWR'UToday's Date: 02/15/2016 Reason for consult: Follow-up assessment;Hyperbilirubinemia;Infant < 6lbs;Late preterm infant   Maternal Data    Feeding Feeding Type: Breast Fed  LATCH Score/Interventions Latch: Grasps breast easily, tongue down, lips flanged, rhythmical sucking. Intervention(s): Skin to skin;Teach feeding cues;Waking techniques Intervention(s): Breast massage  Audible Swallowing: Spontaneous and intermittent  Type of Nipple: Everted at rest and after stimulation Intervention(s): Double electric pump  Comfort (Breast/Nipple): Soft / non-tender     Hold (Positioning): No assistance needed to correctly position infant at breast. Intervention(s): Breastfeeding basics reviewed;Support Pillows  LATCH Score: 10  Lactation Tools Discussed/Used Pump Review: Setup, frequency, and cleaning;Milk Storage   Consult Status      Huston FoleyMOULDEN, Clariza Sickman S 02/15/2016, 10:44 AM

## 2020-05-22 ENCOUNTER — Encounter (HOSPITAL_COMMUNITY): Payer: Self-pay | Admitting: Obstetrics and Gynecology

## 2020-05-22 ENCOUNTER — Inpatient Hospital Stay (HOSPITAL_COMMUNITY)
Admission: AD | Admit: 2020-05-22 | Discharge: 2020-05-22 | Disposition: A | Discharge status: Home / Self Care | Payer: BC Managed Care – PPO | Attending: Obstetrics and Gynecology | Admitting: Obstetrics and Gynecology

## 2020-05-22 ENCOUNTER — Other Ambulatory Visit: Payer: Self-pay

## 2020-05-22 DIAGNOSIS — Z3A10 10 weeks gestation of pregnancy: Secondary | ICD-10-CM | POA: Insufficient documentation

## 2020-05-22 DIAGNOSIS — Z3A09 9 weeks gestation of pregnancy: Secondary | ICD-10-CM

## 2020-05-22 DIAGNOSIS — Z79899 Other long term (current) drug therapy: Secondary | ICD-10-CM | POA: Insufficient documentation

## 2020-05-22 DIAGNOSIS — Z87891 Personal history of nicotine dependence: Secondary | ICD-10-CM | POA: Insufficient documentation

## 2020-05-22 DIAGNOSIS — O200 Threatened abortion: Secondary | ICD-10-CM

## 2020-05-22 LAB — COMPREHENSIVE METABOLIC PANEL
ALT: 14 U/L (ref 0–44)
AST: 20 U/L (ref 15–41)
Albumin: 3.7 g/dL (ref 3.5–5.0)
Alkaline Phosphatase: 33 U/L — ABNORMAL LOW (ref 38–126)
Anion gap: 8 (ref 5–15)
BUN: 5 mg/dL — ABNORMAL LOW (ref 6–20)
CO2: 23 mmol/L (ref 22–32)
Calcium: 8.8 mg/dL — ABNORMAL LOW (ref 8.9–10.3)
Chloride: 104 mmol/L (ref 98–111)
Creatinine, Ser: 0.74 mg/dL (ref 0.44–1.00)
GFR calc non Af Amer: >60 mL/min (ref >60)
Glucose, Bld: 91 mg/dL (ref 70–99)
Potassium: 3.4 mmol/L — ABNORMAL LOW (ref 3.5–5.1)
Sodium: 135 mmol/L (ref 135–145)
Total Bilirubin: 0.6 mg/dL (ref 0.3–1.2)
Total Protein: 6.8 g/dL (ref 6.5–8.1)

## 2020-05-22 LAB — WET PREP, GENITAL
Sperm: NONE SEEN
Trich, Wet Prep: NONE SEEN
Yeast Wet Prep HPF POC: NONE SEEN

## 2020-05-22 LAB — CBC
HCT: 35.9 % — ABNORMAL LOW (ref 36.0–46.0)
Hemoglobin: 11.8 g/dL — ABNORMAL LOW (ref 12.0–15.0)
MCH: 30.3 pg (ref 26.0–34.0)
MCHC: 32.9 g/dL (ref 30.0–36.0)
MCV: 92.1 fL (ref 80.0–100.0)
Platelets: 282 10*3/uL (ref 150–400)
RBC: 3.9 MIL/uL (ref 3.87–5.11)
RDW: 13.5 % (ref 11.5–15.5)
WBC: 5.3 10*3/uL (ref 4.0–10.5)
nRBC: 0 % (ref 0.0–0.2)

## 2020-05-22 LAB — URINALYSIS, ROUTINE W REFLEX MICROSCOPIC
Bilirubin Urine: NEGATIVE
Glucose, UA: NEGATIVE mg/dL
Hgb urine dipstick: NEGATIVE
Ketones, ur: NEGATIVE mg/dL
Leukocytes,Ua: NEGATIVE
Nitrite: NEGATIVE
Protein, ur: NEGATIVE mg/dL
Specific Gravity, Urine: 1.014 (ref 1.005–1.030)
pH: 6 (ref 5.0–8.0)

## 2020-05-22 NOTE — MAU Provider Note (Addendum)
Patient Erica [name] is a 29 y.o. G2P0101  at [redacted]w[redacted]d here with complaints of abdominal pain that started today at 46 m, she has some pains and discomfort that are at night and every other night. She denies vomiting, diarrhea, constipation, vaginal bleeding, contractions, dysuria.   A review of records reveals that patient had [place] appt today at [facility] and was told she was probably having a miscarriage, pending final report. Patient became upset and left; stating she "wanted to let her body take care of it" and "seek a second opinion".   Review of clinic notes reveals, from note, "that patient had Sonogram at 9/28 noted Gestational sac and Yolk sac at 7wks with a 3cm [facility]- no embryo was noted.Sonogram today 1 week later revealed Gestational sac and yolk sac measuring 7+1wks with [facility] 1.55cm"   Patient and husband are asking for [place] and second opinion on possible miscarriage. They are upset that "no one is telling htem what is going on" and appear distressed.    History     CSN: [id number]  Arrival date and time: 05/22/20 1457   First Provider Initiated Contact with Patient 05/22/20 1550      Chief Complaint  Patient presents with  . Abdominal Pain   Abdominal Pain This is a new problem. The current episode started today. The problem occurs intermittently. The pain is at a severity of 7/10 (was a 7, now its a 6). The quality of the pain is cramping. The abdominal pain radiates to the suprapubic region. Pertinent negatives include no constipation, diarrhea, nausea or vomiting. The pain is aggravated by movement.    OB History    Gravida  2   Para  1   Term      Preterm  1   AB      Living  1     SAB      TAB      Ectopic      Multiple  0   Live Births  1           Past Medical History:  Diagnosis Date  . Anemia   . Chlamydia   . Trichomonas infection     Past Surgical History:  Procedure Laterality Date  . MULTIPLE TOOTH EXTRACTIONS       Family History  Problem Relation Age of Onset  . Alcohol abuse Neg Hx   . Arthritis Neg Hx   . Asthma Neg Hx   . Birth defects Neg Hx   . Cancer Neg Hx   . COPD Neg Hx   . Depression Neg Hx   . Diabetes Neg Hx   . Early death Neg Hx   . Drug abuse Neg Hx   . Hearing loss Neg Hx   . Heart disease Neg Hx   . Hyperlipidemia Neg Hx   . Hypertension Neg Hx   . Kidney disease Neg Hx   . Learning disabilities Neg Hx   . Mental illness Neg Hx   . Mental retardation Neg Hx   . Miscarriages / Stillbirths Neg Hx   . Stroke Neg Hx   . Vision loss Neg Hx   . Varicose Veins Neg Hx     Social History   Tobacco Use  . Smoking status: Former Smoker    Packs/day: 0.50    Types: Cigarettes  . Smokeless tobacco: Never Used  Substance Use Topics  . Alcohol use: No    Comment: occ  . Drug  use: No    Allergies: No Known Allergies  Medications Prior to Admission  Medication Sig Dispense Refill Last Dose  . ibuprofen (ADVIL,MOTRIN) 600 MG tablet Take 1 tablet (600 mg total) by mouth every 6 (six) hours as needed. 40 tablet 1  at not taking  . IRON PO Take 1 tablet by mouth daily.    at not taking  . ondansetron (ZOFRAN ODT) 4 MG disintegrating tablet Take 1 tablet (4 mg total) by mouth every 8 (eight) hours as needed for nausea or vomiting. 30 tablet 1  at not taking  . Prenatal Multivit-Min-Fe-FA (PRENATAL/IRON) TABS Take 1 tablet by mouth daily.    at not taking  . triamcinolone cream (KENALOG) 0.1 % Apply 1 application topically daily as needed (eczema).    at not taking    Review of Systems  Gastrointestinal: Positive for abdominal pain. Negative for constipation, diarrhea, nausea and vomiting.   Physical Exam   Blood pressure 112/63, pulse 97, temperature 99.4 F (37.4 C), temperature source Oral, resp. rate 18, height 5\' 4"  (1.626 m), weight 51.9 kg, last menstrual period 03/17/2020, SpO2 99 %, unknown if currently breastfeeding.  Physical Exam Constitutional:       Appearance: She is well-developed.  HENT:     Head: Normocephalic.  Abdominal:     General: Abdomen is flat.  Genitourinary:    Vagina: Normal.     Cervix: Normal.     Uterus: Normal.   Neurological:     Mental Status: She is alert.     MAU Course  Procedures  MDM -Discussed with Dr. 03/19/2020, who  Does not recommend [place] today in MAU as it will not change management.   -Mutiple attempts made to reach [facility] of [place], appears that phones are not working. Reached out to [facility] MD [place], who looked up patient's [place] report. No preliminary report available, including MSD or conclusion of failed pregnancy.  -Explained that [place] results today were not not final, hence could be a reason why her provider today did not definitively diagnose SAB. Patient and husband accepting of this information.   - Given that SAB not definitive, after shared-decision making conversation, patient and husband will wait and repeat US in 10-11 days.   Assessment and Plan   1. Threatened miscarriage in early pregnancy    -Patient counseled extensively on likelihood of failed pregnancy, given strict bleeding and return precautions.  -schedule 12-11 in 11 days with follow up at [facility] -Patient and husband thankful for my assistance in this process [facility] 05/22/2020, 4:16 PM

## 2020-05-22 NOTE — MAU Note (Signed)
Presents with c/o abdominal cramping, denies VB.  States cramping began today @ 1000, no pain meds taken.  Reports +HPT.  LMP 03/17/2020.

## 2020-05-22 NOTE — Discharge Instructions (Signed)
[facility] [address]  [phone]   Threatened Miscarriage  A threatened miscarriage is when you have bleeding from your vagina during the first 20 weeks of pregnancy but the pregnancy does not end. Your doctor will do tests to make sure you are still pregnant. The cause of the bleeding may not be known. This condition does not mean your pregnancy will end, but it does increase the risk that it will end (complete miscarriage). Follow these instructions at home:  Get plenty of rest.  If you have bleeding in your vagina, do not have sex or use tampons.  Do not douche.  Do not smoke or use drugs.  Do not drink alcohol.  Avoid caffeine.  Keep all follow-up prenatal visits as told by your doctor. This is important. Contact a doctor if:  You have light bleeding from your vagina.  You have belly pain or cramping.  You have a fever. Get help right away if:  You have heavy bleeding from your vagina.  You have clots of blood coming from your vagina.  You pass tissue from your vagina.  You have a gush of fluid from your vagina.  You are leaking fluid from your vagina.  You have very bad pain or cramps in your low back or belly.  You have fever, chills, and very bad belly pain. Summary  A threatened miscarriage is when you have bleeding from your vagina during the first 20 weeks of pregnancy but the pregnancy does not end.  This condition does not mean your pregnancy will end, but it does increase the risk that it will end (complete miscarriage).  Get plenty of rest. If you have bleeding in your vagina, do not have sex or use tampons.  Keep all follow-up prenatal visits as told by your doctor. This is important. This information is not intended to replace advice given to you by your health care provider. Make sure you discuss any questions you have with your health care provider. Document Revised: 09/10/2017 Document  Reviewed: 10/31/2016 Elsevier Patient Education  2020 [employer].

## 2020-05-23 LAB — GC/CHLAMYDIA PROBE AMP (~~LOC~~) NOT AT ARMC
Chlamydia: NEGATIVE
Comment: NEGATIVE
Comment: NORMAL
Neisseria Gonorrhea: NEGATIVE

## 2020-05-23 LAB — POCT PREGNANCY, URINE: Preg Test, Ur: POSITIVE — AB

## 2020-05-23 LAB — ABO/RH: ABO/RH(D): O POS

## 2020-05-26 ENCOUNTER — Inpatient Hospital Stay (HOSPITAL_COMMUNITY): Payer: BC Managed Care – PPO

## 2020-05-26 ENCOUNTER — Inpatient Hospital Stay (HOSPITAL_COMMUNITY)
Admission: AD | Admit: 2020-05-26 | Discharge: 2020-05-26 | Disposition: A | Discharge status: Home / Self Care | Payer: BC Managed Care – PPO | Attending: Obstetrics and Gynecology | Admitting: Obstetrics and Gynecology

## 2020-05-26 ENCOUNTER — Other Ambulatory Visit: Payer: Self-pay

## 2020-05-26 ENCOUNTER — Encounter (HOSPITAL_COMMUNITY): Payer: Self-pay | Admitting: Obstetrics and Gynecology

## 2020-05-26 DIAGNOSIS — O209 Hemorrhage in early pregnancy, unspecified: Secondary | ICD-10-CM | POA: Diagnosis present

## 2020-05-26 DIAGNOSIS — Z87891 Personal history of nicotine dependence: Secondary | ICD-10-CM | POA: Insufficient documentation

## 2020-05-26 DIAGNOSIS — O039 Complete or unspecified spontaneous abortion without complication: Secondary | ICD-10-CM | POA: Insufficient documentation

## 2020-05-26 DIAGNOSIS — Z6740 Type O blood, Rh positive: Secondary | ICD-10-CM

## 2020-05-26 DIAGNOSIS — O4690 Antepartum hemorrhage, unspecified, unspecified trimester: Secondary | ICD-10-CM

## 2020-05-26 LAB — URINALYSIS, ROUTINE W REFLEX MICROSCOPIC
Bacteria, UA: NONE SEEN
Bilirubin Urine: NEGATIVE
Glucose, UA: NEGATIVE mg/dL
Ketones, ur: NEGATIVE mg/dL
Leukocytes,Ua: NEGATIVE
Nitrite: NEGATIVE
Protein, ur: NEGATIVE mg/dL
Specific Gravity, Urine: 1.011 (ref 1.005–1.030)
pH: 7 (ref 5.0–8.0)

## 2020-05-26 MED ORDER — MISOPROSTOL 200 MCG PO TABS: 800.0000 ug | ORAL_TABLET | Freq: Once | ORAL | 0 refills | Status: AC

## 2020-05-26 MED ORDER — OXYCODONE-ACETAMINOPHEN 5-325 MG PO TABS: 1.0000 | ORAL_TABLET | ORAL | 0 refills | Status: AC | PRN

## 2020-05-26 MED ORDER — IBUPROFEN 600 MG PO TABS: 600.0000 mg | ORAL_TABLET | Freq: Four times a day (QID) | ORAL | 0 refills | Status: AC | PRN

## 2020-05-26 NOTE — Discharge Instructions (Signed)
Miscarriage A miscarriage is the loss of an unborn baby (fetus) before the 20th week of pregnancy. Follow these instructions at home: Medicines   Take over-the-counter and prescription medicines only as told by your doctor.  If you were prescribed antibiotic medicine, take it as told by your doctor. Do not stop taking the antibiotic even if you start to feel better.  Do not take NSAIDs unless your doctor says that this is safe for you. NSAIDs include aspirin and ibuprofen. These medicines can cause bleeding. Activity  Rest as directed. Ask your doctor what activities are safe for you.  Have someone help you at home during this time. General instructions  Write down how many pads you use each day and how soaked they are.  Watch the amount of tissue or clumps of blood (blood clots) that you pass from your vagina. Save any large amounts of tissue for your doctor.  Do not use tampons, douche, or have sex until your doctor approves.  To help you and your partner with the process of grieving, talk with your doctor or seek counseling.  When you are ready, meet with your doctor to talk about steps you should take for your health. Also, talk with your doctor about steps to take to have a healthy pregnancy in the future.  Keep all follow-up visits as told by your doctor. This is important. Contact a doctor if:  You have a fever or chills.  You have vaginal discharge that smells bad.  You have more bleeding. Get help right away if:  You have very bad cramps or pain in your back or belly.  You pass clumps of blood that are walnut-sized or larger from your vagina.  You pass tissue that is walnut-sized or larger from your vagina.  You soak more than 1 regular pad in an hour.  You get light-headed or weak.  You faint (pass out).  You have feelings of sadness that do not go away, or you have thoughts of hurting yourself. Summary  A miscarriage is the loss of an unborn baby before  the 20th week of pregnancy.  Follow your doctor's instructions for home care. Keep all follow-up appointments.  To help you and your partner with the process of grieving, talk with your doctor or seek counseling. This information is not intended to replace advice given to you by your health care provider. Make sure you discuss any questions you have with your health care provider. Document Revised: 11/26/2018 Document Reviewed: 09/09/2016 Elsevier Patient Education  2020 Else[employer] Inc.   Managing Pregnancy Loss Pregnancy loss can happen any time during a pregnancy. Often the cause is not known. It is rarely because of anything you did. Pregnancy loss in early pregnancy (during the first trimester) is called a miscarriage. This type of pregnancy loss is the most common. Pregnancy loss that happens after 20 weeks of pregnancy is called fetal demise if the baby's heart stops beating before birth. Fetal demise is much less common. Some women experience spontaneous labor shortly after fetal demise resulting in a stillborn birth (stillbirth). Any pregnancy loss can be devastating. You will need to recover both physically and emotionally. Most women are able to get pregnant again after a pregnancy loss and deliver a healthy baby. How to manage emotional recovery  Pregnancy loss is very hard emotionally. You may feel many different emotions while you grieve. You may feel sad and angry. You may also feel guilty. It is normal to have periods of crying.   Emotional recovery can take longer than physical recovery. It is different for everyone. Taking these steps can help you in managing this loss:  Remember that it is unlikely you did anything to cause the pregnancy loss.  Share your thoughts and feelings with friends, family, and your partner. Remember that your partner is also recovering emotionally.  Make sure you have a good support system. Do not spend too much time alone.  Meet with a pregnancy loss  counselor or join a pregnancy loss support group.  Get enough sleep and eat a healthy diet. Return to regular exercise when you have recovered physically.  Do not use drugs or alcohol to manage your emotions.  Consider seeing a mental health professional to help you recover emotionally.  Ask a friend or loved one to help you decide what to do with any clothing and nursery items you received for your baby. In the case of a stillbirth, many women benefit from taking additional steps in the grieving process. You may want to:  Hold your baby after the birth.  Name your baby.  Request a birth certificate.  Create a keepsake such as handprints or footprints.  Dress your baby and have a picture taken.  Make funeral arrangements.  Ask for a baptism or blessing. Hospitals have staff members who can help you with all these arrangements. How to recognize emotional stress It is normal to have emotional stress after a pregnancy loss. But emotional stress that lasts a long time or becomes severe requires treatment. Watch out for these signs of severe emotional stress:  Sadness, anger, or guilt that is not going away and is interfering with your normal activities.  Relationship problems that have occurred or gotten worse since the pregnancy loss.  Signs of depression that last longer than 2 weeks. These may include: ? Sadness. ? Anxiety. ? Hopelessness. ? Loss of interest in activities you enjoy. ? Inability to concentrate. ? Trouble sleeping or sleeping too much. ? Loss of appetite or overeating. ? Thoughts of death or of hurting yourself. Follow these instructions at home:  Take over-the-counter and prescription medicines only as told by your health care provider.  Rest at home until your energy level returns. Return to your normal activities as told by your health care provider. Ask your health care provider what activities are safe for you.  When you are ready, meet with your  health care provider to discuss steps to take for a future pregnancy.  Keep all follow-up visits as told by your health care provider. This is important. Where to find support  To help you and your partner with the process of grieving, talk with your health care provider or seek counseling.  Consider meeting with others who have experienced pregnancy loss. Ask your health care provider about support groups and resources. Where to find more information  [place] Department of Health and Human Services Office on Women's Health: www.womenshealth.gov  American Pregnancy Association: www.americanpregnancy.org Contact a health care provider if:  You continue to experience grief, sadness, or lack of motivation for everyday activities, and those feelings do not improve over time.  You are struggling to recover emotionally, especially if you are using alcohol or substances to help. Get help right away if:  You have thoughts of hurting yourself or others. If you ever feel like you may hurt yourself or others, or have thoughts about taking your own life, get help right away. You can go to your nearest emergency department or call:    Your local emergency services (911 in the [place]  A suicide crisis helpline, such as the National Suicide Prevention Lifeline at 1-800-273-8255. This is open 24 hours a day. Summary  Any pregnancy loss can be difficult physically and emotionally.  You may experience many different emotions while you grieve. Emotional recovery can last longer than physical recovery.  It is normal to have emotional stress after a pregnancy loss. But emotional stress that lasts a long time or becomes severe requires treatment.  See your health care provider if you are struggling emotionally after a pregnancy loss. This information is not intended to replace advice given to you by your health care provider. Make sure you discuss any questions you have with your health care  provider. Document Revised: 11/24/2018 Document Reviewed: 10/15/2017 Elsevier Patient Education  2020 Elsevier Inc.  

## 2020-05-26 NOTE — MAU Note (Signed)
Pt reports to mau with c/o vag bleeding that started around 11 this morning.  Pt states bleeding started out as brown but is now red.  Pt states bleeding is lighter than a period but heavier than spotting.  Denies any pain at this time.

## 2020-05-26 NOTE — MAU Provider Note (Signed)
History     CSN: [id number]  Arrival date and time: 05/26/20 1327   First Provider Initiated Contact with Patient 05/26/20 1413      Chief Complaint  Patient presents with  . Vaginal Bleeding   HPI  Ms.Erica [name] is a 29 y.o. female 732P0101 @ 9526w0d; certain period here with vaginal bleeding.  Brown colored discharge around 1120; and now the bleeding is bright red. This is a new problem. She has no pain. She was diagnosed with a threatened SAB with [facility] on  9/28 & 10/5; she was not ready to make a decision about treatment/ management.    Reports preterm delivery at 34weeks spontaneously with previous pregnancy.   Copied from care everywhere note 10/5:  Sonogram at 9/28 noted Gestational sac and [facility]olk [facility] at 7wks [facility] was noted. Patient denies any vaginal bleeding today. Sonogram today 1 week later revealed Gestational sac and yolk sac measuring 7+[facility]wks [facility]. Patient asked what were SAB precautions- Spontaneous miscarriage- include the signs ans symptoms of a miscarriage- bleeding, cramping or abdominal pain. Explained to patient that the findings of today's sonogram reveal a likely missed AB. Before all options could be explained- patient stated that she needed to leave due to her daughter being outside and that she want to allow her body to handle everything. Desires a second opinion. Explained that a second options is absolutely an option and she could definitely seek it.   OB History    Gravida  2   Para  1   Term      Preterm  1   AB      Living  1     SAB      TAB      Ectopic      Multiple  0   Live Births  1           Past Medical History:  Diagnosis Date  . Anemia   . Chlamydia   . Trichomonas infection     Past Surgical History:  Procedure Laterality Date  . MULTIPLE TOOTH EXTRACTIONS      Family History  Problem Relation Age of Onset  . Alcohol abuse Neg Hx   . Arthritis Neg Hx   . Asthma Neg  Hx   . Birth defects Neg Hx   . Cancer Neg Hx   . COPD Neg Hx   . Depression Neg Hx   . Diabetes Neg Hx   . Early death Neg Hx   . Drug abuse Neg Hx   . Hearing loss Neg Hx   . Heart disease Neg Hx   . Hyperlipidemia Neg Hx   . Hypertension Neg Hx   . Kidney disease Neg Hx   . Learning disabilities Neg Hx   . Mental illness Neg Hx   . Mental retardation Neg Hx   . Miscarriages / Stillbirths Neg Hx   . Stroke Neg Hx   . Vision loss Neg Hx   . Varicose Veins Neg Hx     Social History   Tobacco Use  . Smoking status: Former Smoker    Packs/day: 0.50    Types: Cigarettes  . Smokeless tobacco: Never Used  Substance Use Topics  . Alcohol use: No    Comment: occ  . Drug use: No    Allergies: No Known Allergies  Medications Prior to Admission  Medication Sig Dispense Refill Last Dose  . Prenatal Multivit-Min-Fe-FA (PRENATAL/IRON)  TABS Take 1 tablet by mouth daily.      Recent Results (from the past 2160 hour(s))  Pregnancy, urine POC     Status: Abnormal   Collection Time: 05/22/20  3:21 PM  Result Value Ref Range   Preg Test, Ur POSITIVE (A) NEGATIVE    Comment:        THE SENSITIVITY OF THIS METHODOLOGY IS >24 mIU/mL   Urinalysis, Routine w reflex microscopic Urine, Clean Catch     Status: Abnormal   Collection Time: 05/22/20  3:25 PM  Result Value Ref Range   Color, Urine YELLOW YELLOW   APPearance HAZY (A) CLEAR   Specific Gravity, Urine 1.014 1.005 - 1.030   pH 6.0 5.0 - 8.0   Glucose, UA NEGATIVE NEGATIVE mg/dL   Hgb urine dipstick NEGATIVE NEGATIVE   Bilirubin Urine NEGATIVE NEGATIVE   Ketones, ur NEGATIVE NEGATIVE mg/dL   Protein, ur NEGATIVE NEGATIVE mg/dL   Nitrite NEGATIVE NEGATIVE   Leukocytes,Ua NEGATIVE NEGATIVE    Comment: Performed at [facility], [address], [place], [place] [address]  Wet prep, genital     Status: Abnormal   Collection Time: 05/22/20  4:06 PM   Specimen: Vaginal  Result Value Ref Range   Yeast Wet Prep HPF POC  NONE SEEN NONE SEEN   Trich, Wet Prep NONE SEEN NONE SEEN   Clue Cells Wet Prep HPF POC PRESENT (A) NONE SEEN   WBC, Wet Prep HPF POC MANY (A) NONE SEEN   Sperm NONE SEEN     Comment: Performed at [facility], [address] [place], [place] [address]  GC/Chlamydia probe amp ([place])not at [facility]     Status: None   Collection Time: 05/22/20  4:06 PM  Result Value Ref Range   Neisseria Gonorrhea Negative    Chlamydia Negative    Comment Normal Reference Ranger Chlamydia - Negative    Comment      Normal Reference Range Neisseria Gonorrhea - Negative  Comprehensive metabolic panel     Status: Abnormal   Collection Time: 05/22/20  5:38 PM  Result Value Ref Range   Sodium 135 135 - 145 mmol/L   Potassium 3.4 (L) 3.5 - 5.1 mmol/L   Chloride 104 98 - 111 mmol/L   CO2 23 22 - 32 mmol/L   Glucose, Bld 91 70 - 99 mg/dL    Comment: Glucose reference range applies only to samples taken after fasting for at least 8 hours.   BUN 5 (L) 6 - 20 mg/dL   Creatinine, Ser 9.70 0.44 - 1.00 mg/dL   Calcium 8.8 (L) 8.9 - 10.3 mg/dL   Total Protein 6.8 6.5 - 8.1 g/dL   Albumin 3.7 3.5 - 5.0 g/dL   AST 20 15 - 41 U/L   ALT 14 0 - 44 U/L   Alkaline Phosphatase 33 (L) 38 - 126 U/L   Total Bilirubin 0.6 0.3 - 1.2 mg/dL   GFR calc non Af Amer >60 >60 mL/min   Anion gap 8 5 - 15    Comment: Performed at [facility], [address], [place], [place] [address]  CBC     Status: Abnormal   Collection Time: 05/22/20  5:39 PM  Result Value Ref Range   WBC 5.3 4.0 - 10.5 K/uL   RBC 3.90 3.87 - 5.11 MIL/uL   Hemoglobin 11.8 (L) 12.0 - 15.0 g/dL   HCT 58.8 (L) 36 - 46 %   MCV  92.1 80.0 - 100.0 fL   MCH 30.3 26.0 - 34.0 pg   MCHC 32.9 30.0 - 36.0 g/dL   RDW 76.1 60.7 - 37.1 %   Platelets 282 150 - 400 K/uL   nRBC 0.0 0.0 - 0.2 %    Comment: Performed at [facility], [address], [place], [place] [address]  ABO/Rh     Status: None   Collection Time: 05/22/20  5:41 PM  Result  Value Ref Range   ABO/RH(D) O POS    No rh immune globuloin      NOT A RH IMMUNE GLOBULIN CANDIDATE, PT RH POSITIVE Performed at [facility], [address] [place], [place] [address]   Urinalysis, Routine w reflex microscopic Urine, Clean Catch     Status: Abnormal   Collection Time: 05/26/20  1:44 PM  Result Value Ref Range   Color, Urine STRAW (A) YELLOW   APPearance CLEAR CLEAR   Specific Gravity, Urine 1.011 1.005 - 1.030   pH 7.0 5.0 - 8.0   Glucose, UA NEGATIVE NEGATIVE mg/dL   Hgb urine dipstick MODERATE (A) NEGATIVE   Bilirubin Urine NEGATIVE NEGATIVE   Ketones, ur NEGATIVE NEGATIVE mg/dL   Protein, ur NEGATIVE NEGATIVE mg/dL   Nitrite NEGATIVE NEGATIVE   Leukocytes,Ua NEGATIVE NEGATIVE   RBC / HPF 0-5 0 - 5 RBC/hpf   WBC, UA 0-5 0 - 5 WBC/hpf   Bacteria, UA NONE SEEN NONE SEEN   Squamous Epithelial / LPF 0-5 0 - 5    Comment: Performed at [facility], [address], [place], [place] [address]    US OB LESS THAN 14 WEEKS WITH [place] TRANSVAGINAL  Result Date: 05/26/2020 CLINICAL DATA:  Ten weeks pregnant.  Bleeding. EXAM: OBSTETRIC <14 WK [place] AND TRANSVAGINAL OB US TECHNIQUE: Both transabdominal and transvaginal ultrasound examinations were performed for complete evaluation of the gestation as well as the maternal uterus, adnexal regions, and pelvic cul-de-sac. Transvaginal technique was performed to assess early pregnancy. COMPARISON:  None. FINDINGS: Intrauterine gestational sac: Single Yolk sac:  Visualized.  Measuring 14 mm. Embryo:  Not visualized. Cardiac Activity: Not visualized. Heart Rate: Does not apply bpm MSD: 22 mm   7 w   0 d CRL:    mm    w    d                  [place] EDC: Subchorionic hemorrhage:  None visualized. Maternal uterus/adnexae: Bilateral ovaries are normal. IMPRESSION: Findings consistent with abortion in progress. The yolk sac is visualized but no embryo is noted. Electronically Signed   By: Sherian Rein M.D.   On: 05/26/2020 15:14   Review  of Systems  Gastrointestinal: Negative for abdominal pain.  Genitourinary: Positive for vaginal bleeding.   Physical Exam   Blood pressure 111/73, pulse 63, temperature 98.4 F (36.9 C), temperature source Oral, resp. rate 16, last menstrual period 03/17/2020, unknown if currently breastfeeding.  Physical Exam Constitutional:      General: She is not in acute distress.    Appearance: Normal appearance. She is not ill-appearing or toxic-appearing.  Pulmonary:     Effort: Pulmonary effort is normal.  Abdominal:     General: Abdomen is flat.     Palpations: Abdomen is soft.     Tenderness: There is no abdominal tenderness.  Genitourinary:    Comments: Cervix FT, anterior. Small amount of dark red blood noted on exam glove.  Musculoskeletal:  Cervical back: Normal range of motion.  Neurological:     Mental Status: She is alert.    MAU Course  Procedures None  MDM  O positive blood type [place] ordered today CBC stable.   [place] from 10/5: Gestational and yolk sac 7+1wk, SCH smaller, no embryo- sono 1 week prior, 7wks [place] from 9/28: [facility] noted, GS and yolk sac no embryo She was counseled on options for management--observation, medical treatment with Cytotec,  or D&E.  She elected to proceed with Cytotec    Assessment and Plan   A:  1. SAB (spontaneous abortion)   2. Vaginal bleeding during pregnancy   3. Type O blood, Rh positive     P:  Discharge home in stable condition Rx: Percocet, Ibuprofen, and Cytotec F/u in 2 weeks in the office; message sent Return to MAU if symptoms worsen  Bleeding precautions  Shernell Saldierna, Harolyn Rutherford, NP 05/28/2020 2:36 PM

## 2020-06-06 ENCOUNTER — Ambulatory Visit: Payer: BC Managed Care – PPO

## 2020-06-12 ENCOUNTER — Telehealth (INDEPENDENT_AMBULATORY_CARE_PROVIDER_SITE_OTHER): Payer: BC Managed Care – PPO | Admitting: Student

## 2020-06-12 DIAGNOSIS — O039 Complete or unspecified spontaneous abortion without complication: Secondary | ICD-10-CM | POA: Insufficient documentation

## 2020-06-12 DIAGNOSIS — Z5189 Encounter for other specified aftercare: Secondary | ICD-10-CM | POA: Diagnosis not present

## 2020-06-12 NOTE — Progress Notes (Signed)
I connected with  Erica [name] on 06/12/20 at  1:15 PM EDT by telephone and verified that I am speaking with the correct person using two identifiers.   I discussed the limitations, risks, security and privacy concerns of performing an evaluation and management service by telephone and the availability of in person appointments. I also discussed with the patient that there may be a patient responsible charge related to this service. The patient expressed understanding and agreed to proceed.  Erica [name], CMA 06/12/2020  11:03 AM

## 2020-06-12 NOTE — Progress Notes (Signed)
Patient ID: Erica [name], female   DOB: 06/28/1991, 29 y.o.   MRN: [id number]   TELEHEALTH GYNECOLOGY VISIT ENCOUNTER NOTE  I connected with Erica [name] on 06/12/20 at  1:15 PM EDT by telephone at home and verified that I am speaking with the correct person using two identifiers.   I discussed the limitations, risks, security and privacy concerns of performing an evaluation and management service by telephone and the availability of in person appointments. I also discussed with the patient that there may be a patient responsible charge related to this service. The patient expressed understanding and agreed to proceed.   History:  Erica [name] is a 29 y.o. G72P0101 female being evaluated today for follow up from SAB. She denies pain, a little cramping. She never took Cytotec but reports passing a large clot two days after her 2nd MAU visit and then she felt "much better". She denies any abnormal vaginal discharge, bleeding, pelvic pain or other concerns.  She denies fever or foul vaginal odor.      Past Medical History:  Diagnosis Date   Anemia    Chlamydia    Trichomonas infection    Past Surgical History:  Procedure Laterality Date   MULTIPLE TOOTH EXTRACTIONS     The following portions of the patient's history were reviewed and updated as appropriate: allergies, current medications, past family history, past medical history, past social history, past surgical history and problem list.   Health Maintenance:  Had pap this year at [facility] in [place], reports it was normal.   Review of Systems:  Pertinent items noted in HPI and remainder of comprehensive ROS otherwise negative.  Physical Exam:   General:  Alert, oriented and cooperative.   Mental Status: Normal mood and affect perceived. Normal judgment and thought content.  Physical exam deferred due to nature of the encounter  Labs and Imaging No results found for this or any previous visit (from the past 336  hour(s)). US OB LESS THAN 14 WEEKS WITH OB TRANSVAGINAL  Result Date: 05/26/2020 CLINICAL DATA:  Ten weeks pregnant.  Bleeding. EXAM: OBSTETRIC <14 WK [place] AND TRANSVAGINAL OB US TECHNIQUE: Both transabdominal and transvaginal ultrasound examinations were performed for complete evaluation of the gestation as well as the maternal uterus, adnexal regions, and pelvic cul-de-sac. Transvaginal technique was performed to assess early pregnancy. COMPARISON:  None. FINDINGS: Intrauterine gestational sac: Single Yolk sac:  Visualized.  Measuring 14 mm. Embryo:  Not visualized. Cardiac Activity: Not visualized. Heart Rate: Does not apply bpm MSD: 22 mm   7 w   0 d CRL:    mm    w    d                  [place] EDC: Subchorionic hemorrhage:  None visualized. Maternal uterus/adnexae: Bilateral ovaries are normal. IMPRESSION: Findings consistent with abortion in progress. The yolk sac is visualized but no embryo is noted. Electronically Signed   By: Sherian Rein M.D.   On: 05/26/2020 15:14      Assessment and Plan:      1. Follow-up visit after miscarriage    -Patient doing well, very happy with her care at [facility].  -She is not sure about getting pregnant again, she would like to wait "a bit"; declines birth control today.     I discussed the assessment and treatment plan with the patient. The patient was provided an opportunity to ask questions and all were answered. The patient agreed with the plan  and demonstrated an understanding of the instructions.   I provided 20 minutes of non-face-to-face time during this encounter.   Marylene Land, CNM [facility] [employer], [facility]

## 2020-09-12 ENCOUNTER — Telehealth: Payer: Self-pay | Admitting: Student

## 2020-09-12 ENCOUNTER — Encounter: Payer: Self-pay | Admitting: Student

## 2020-09-12 NOTE — Telephone Encounter (Signed)
Left voicemail for patient; congratulating her on move and new pregnancy; will call back later on today.  Erica [name]

## 2020-09-12 NOTE — Telephone Encounter (Signed)
TC to patient to discuss pregnancy questions; no answer. VM left; will send mychart message.

## 2020-09-20 ENCOUNTER — Other Ambulatory Visit: Payer: Self-pay | Admitting: Student

## 2020-09-20 ENCOUNTER — Encounter: Payer: Self-pay | Admitting: *Deleted

## 2020-09-20 DIAGNOSIS — O200 Threatened abortion: Secondary | ICD-10-CM

## 2020-09-21 ENCOUNTER — Ambulatory Visit
Admission: RE | Admit: 2020-09-21 | Discharge: 2020-09-21 | Disposition: A | Discharge status: Home / Self Care | Payer: BC Managed Care – PPO | Source: Ambulatory Visit | Attending: Student | Admitting: Student

## 2020-09-21 ENCOUNTER — Other Ambulatory Visit: Payer: Self-pay

## 2020-09-21 DIAGNOSIS — O200 Threatened abortion: Secondary | ICD-10-CM | POA: Diagnosis present

## 2020-09-24 ENCOUNTER — Telehealth: Payer: Self-pay | Admitting: Family Medicine

## 2020-09-24 ENCOUNTER — Telehealth: Payer: Self-pay | Admitting: Medical

## 2020-09-24 ENCOUNTER — Other Ambulatory Visit: Payer: Self-pay | Admitting: Student

## 2020-09-24 DIAGNOSIS — O219 Vomiting of pregnancy, unspecified: Secondary | ICD-10-CM

## 2020-09-24 MED ORDER — PROMETHAZINE HCL 25 MG PO TABS: 25.0000 mg | ORAL_TABLET | Freq: Four times a day (QID) | ORAL | 1 refills | Status: DC | PRN

## 2020-09-24 MED ORDER — ONDANSETRON 8 MG PO TBDP: 8.0000 mg | ORAL_TABLET | Freq: Three times a day (TID) | ORAL | 0 refills | Status: AC | PRN

## 2020-09-24 NOTE — Telephone Encounter (Signed)
I called Erica [name] today at 10:45 AM and confirmed patient's identity using two patient identifiers. [place] results from Friday afternoon were reviewed. Patient is not yet scheduled for new OB visit. Will call to schedule today. Patient states frequent N/V and no current medications. Confirmed no allergies and Rx for Phenergan was sent to patient's pharmacy of choice. First trimester warning signs reviewed. Patient voiced understanding and had no further questions.   US OB LESS THAN 14 WEEKS WITH OB TRANSVAGINAL  Result Date: 09/21/2020 CLINICAL DATA:  [place] EXAM: OBSTETRIC <14 WK [place] AND TRANSVAGINAL OB US TECHNIQUE: Both transabdominal and transvaginal ultrasound examinations were performed for complete evaluation of the gestation as well as the maternal uterus, adnexal regions, and pelvic cul-de-sac. Transvaginal technique was performed to assess early pregnancy. COMPARISON:  May 26, 2020 FINDINGS: Intrauterine gestational sac: Single Yolk sac:  Visualized. Embryo:  Visualized. Cardiac Activity: Visualized. Heart Rate: 144 bpm CRL:  9.6 mm 7 w 0 d                  [place] EDC: 05/10/2021 LMP: 08/03/2020. Gestational age by LMP is 7 weeks 0 days. EDC by LMP is 05/10/2021. Subchorionic hemorrhage: None Right ovary: RIGHT ovary demonstrates several small follicles. It measures 3.3 x 1.8 x 2.2 cm. Left ovary: There is a corpus luteal cyst in the LEFT ovary. LEFT ovary measures 4.0 x 2.9 x 2.5 cm. Other :None Free fluid:  None IMPRESSION: 1. There is a single live intrauterine pregnancy. Gestational age assigned by LMP is 7 weeks 0 days. EDC is 05/10/2021 Electronically Signed   By: Meda Klinefelter MD   On: 09/21/2020 15:40     Vonzella Nipple, PA-C 09/24/2020 10:45 AM

## 2020-09-24 NOTE — Telephone Encounter (Signed)
-----   Message from Levonne Hubert, RDMS, RVT sent at 09/21/2020  3:32 PM EST ----- Regarding: Early OB Results We have just completed an outpatient ultrasound scheduled for a patient who was seen at [facility].  Please call the patient with the results.     Thanks Raynelle Fanning!

## 2020-09-24 NOTE — Telephone Encounter (Signed)
Per chart review, Erica [name] spoke w/pt earlier today and Rx for Phenergan was sent to pharmacy. An additional Rx for Ondansetron was also e-prescribed by a different provider.

## 2020-09-24 NOTE — Telephone Encounter (Signed)
After hrs nurse- Diane called and stated that this pt has Ultrasound Fri and is 8 weeks preg. Pt is having nausea, vomiting and was going to send in RX but no visit for [facility] yet for this. Request rx for Zofran be sent to pharmacy for pt. Please call Patient about the concerns.

## 2020-10-12 ENCOUNTER — Ambulatory Visit (INDEPENDENT_AMBULATORY_CARE_PROVIDER_SITE_OTHER): Payer: BC Managed Care – PPO | Admitting: Advanced Practice Midwife

## 2020-10-12 ENCOUNTER — Encounter: Payer: Self-pay | Admitting: Advanced Practice Midwife

## 2020-10-12 ENCOUNTER — Other Ambulatory Visit: Payer: Self-pay

## 2020-10-12 ENCOUNTER — Other Ambulatory Visit (HOSPITAL_COMMUNITY)
Admission: RE | Admit: 2020-10-12 | Discharge: 2020-10-12 | Disposition: A | Discharge status: Home / Self Care | Payer: BC Managed Care – PPO | Source: Ambulatory Visit | Attending: Advanced Practice Midwife | Admitting: Advanced Practice Midwife

## 2020-10-12 VITALS — BP 115/75 | HR 104 | Wt 125.5 lb

## 2020-10-12 DIAGNOSIS — O219 Vomiting of pregnancy, unspecified: Secondary | ICD-10-CM

## 2020-10-12 DIAGNOSIS — Z3490 Encounter for supervision of normal pregnancy, unspecified, unspecified trimester: Secondary | ICD-10-CM

## 2020-10-12 DIAGNOSIS — O09891 Supervision of other high risk pregnancies, first trimester: Secondary | ICD-10-CM

## 2020-10-12 DIAGNOSIS — Z3A10 10 weeks gestation of pregnancy: Secondary | ICD-10-CM

## 2020-10-12 LAB — POCT URINALYSIS DIP (DEVICE)
Bilirubin Urine: NEGATIVE
Glucose, UA: NEGATIVE mg/dL
Hgb urine dipstick: NEGATIVE
Ketones, ur: NEGATIVE mg/dL
Leukocytes,Ua: NEGATIVE
Nitrite: NEGATIVE
Protein, ur: NEGATIVE mg/dL
Specific Gravity, Urine: 1.02 (ref 1.005–1.030)
Urobilinogen, UA: 0.2 mg/dL (ref 0.0–1.0)
pH: 7 (ref 5.0–8.0)

## 2020-10-12 MED ORDER — PROMETHAZINE HCL 25 MG PO TABS: 25.0000 mg | ORAL_TABLET | Freq: Four times a day (QID) | ORAL | 2 refills | Status: AC | PRN

## 2020-10-12 MED ORDER — BLOOD PRESSURE KIT DEVI: 1.0000 | 0 refills | Status: AC | PRN

## 2020-10-12 NOTE — Patient Instructions (Addendum)
Second Trimester of Pregnancy  The second trimester of pregnancy is from week 13 through week 27. This is also called months 4 through 6 of pregnancy. This is often the time when you feel your best. During the second trimester:  Morning sickness is less or has stopped.  You may have more energy.  You may feel hungry more often. At this time, your unborn baby (fetus) is growing very fast. At the end of the sixth month, the unborn baby may be up to 12 inches long and weigh about 1 pounds. You will likely start to feel the baby move between 16 and 20 weeks of pregnancy. Body changes during your second trimester Your body continues to go through many changes during this time. The changes vary and generally return to normal after the baby is born. Physical changes  You will gain more weight.  You may start to get stretch marks on your hips, belly (abdomen), and breasts.  Your breasts will grow and may hurt.  Dark spots or blotches may develop on your face.  A dark line from your belly button to the pubic area (linea nigra) may appear.  You may have changes in your hair. Health changes  You may have headaches.  You may have heartburn.  You may have trouble pooping (constipation).  You may have hemorrhoids or swollen, bulging veins (varicose veins).  Your gums may bleed.  You may pee (urinate) more often.  You may have back pain. Follow these instructions at home: Medicines  Take over-the-counter and prescription medicines only as told by your doctor. Some medicines are not safe during pregnancy.  Take a prenatal vitamin that contains at least 600 micrograms (mcg) of folic acid. Eating and drinking  Eat healthy meals that include: ? Fresh fruits and vegetables. ? Whole grains. ? Good sources of protein, such as meat, eggs, or tofu. ? Low-fat dairy products.  Avoid raw meat and unpasteurized juice, milk, and cheese.  You may need to take these actions to prevent or  treat trouble pooping: ? Drink enough fluids to keep your pee (urine) pale yellow. ? Eat foods that are high in fiber. These include beans, whole grains, and fresh fruits and vegetables. ? Limit foods that are high in fat and sugar. These include fried or sweet foods. Activity  Exercise only as told by your doctor. Most people can do their usual exercise during pregnancy. Try to exercise for 30 minutes at least 5 days a week.  Stop exercising if you have pain or cramps in your belly or lower back.  Do not exercise if it is too hot or too humid, or if you are in a place of great height (high altitude).  Avoid heavy lifting.  If you choose to, you may have sex unless your doctor tells you not to. Relieving pain and discomfort  Wear a good support bra if your breasts are sore.  Take warm water baths (sitz baths) to soothe pain or discomfort caused by hemorrhoids. Use hemorrhoid cream if your doctor approves.  Rest with your legs raised (elevated) if you have leg cramps or low back pain.  If you develop bulging veins in your legs: ? Wear support hose as told by your doctor. ? Raise your feet for 15 minutes, 3-4 times a day. ? Limit salt in your food. Safety  Wear your seat belt at all times when you are in a car.  Talk with your doctor if someone is hurting you or yelling  at you a lot. Lifestyle  Do not use hot tubs, steam rooms, or saunas.  Do not douche. Do not use tampons or scented sanitary pads.  Avoid cat litter boxes and soil used by cats. These carry germs that can harm your baby and can cause a loss of your baby by miscarriage or stillbirth.  Do not use herbal medicines, illegal drugs, or medicines that are not approved by your doctor. Do not drink alcohol.  Do not smoke or use any products that contain nicotine or tobacco. If you need help quitting, ask your doctor. General instructions  Keep all follow-up visits. This is important.  Ask your doctor about local  prenatal classes.  Ask your doctor about the right foods to eat or for help finding a counselor. Where to find more information  American Pregnancy Association: americanpregnancy.org  American College of Obstetricians and Gynecologists: www.acog.org  Office on Women's Health: womenshealth.gov/pregnancy Contact a doctor if:  You have a headache that does not go away when you take medicine.  You have changes in how you see, or you see spots in front of your eyes.  You have mild cramps, pressure, or pain in your lower belly.  You continue to feel like you may vomit (nauseous), you vomit, or you have watery poop (diarrhea).  You have bad-smelling fluid coming from your vagina.  You have pain when you pee or your pee smells bad.  You have very bad swelling of your face, hands, ankles, feet, or legs.  You have a fever. Get help right away if:  You are leaking fluid from your vagina.  You have spotting or bleeding from your vagina.  You have very bad belly cramping or pain.  You have trouble breathing.  You have chest pain.  You faint.  You have not felt your baby move for the time period told by your doctor.  You have new or increased pain, swelling, or redness in an arm or leg. Summary  The second trimester of pregnancy is from week 13 through week 27 (months 4 through 6).  Eat healthy meals.  Exercise as told by your doctor. Most people can do their usual exercise during pregnancy.  Do not use herbal medicines, illegal drugs, or medicines that are not approved by your doctor. Do not drink alcohol.  Call your doctor if you get sick or if you notice anything unusual about your pregnancy. This information is not intended to replace advice given to you by your health care provider. Make sure you discuss any questions you have with your health care provider. Document Revised: 01/11/2020 Document Reviewed: 11/17/2019 Elsevier Patient Education  2021 Elsevier  Inc.  Safe Medications in Pregnancy   Acne: Benzoyl Peroxide Salicylic Acid  Backache/Headache: Tylenol: 2 regular strength every 4 hours OR              2 Extra strength every 6 hours  Colds/Coughs/Allergies: Benadryl (alcohol free) 25 mg every 6 hours as needed Breath right strips Claritin Cepacol throat lozenges Chloraseptic throat spray Cold-Eeze- up to three times per day Cough drops, alcohol free Flonase (by prescription only) Guaifenesin Mucinex Robitussin DM (plain only, alcohol free) Saline nasal spray/drops Sudafed (pseudoephedrine) & Actifed ** use only after [redacted] weeks gestation and if you do not have high blood pressure Tylenol Vicks Vaporub Zinc lozenges Zyrtec   Constipation: Colace Ducolax suppositories Fleet enema Glycerin suppositories Metamucil Milk of magnesia Miralax Senokot Smooth move tea  Diarrhea: Kaopectate Imodium A-D  *NO pepto Bismol    Hemorrhoids: Anusol Anusol HC Preparation H Tucks  Indigestion: Tums Maalox Mylanta Zantac  Pepcid  Insomnia: Benadryl (alcohol free) 25mg  every 6 hours as needed Tylenol PM Unisom, no Gelcaps  Leg Cramps: Tums MagGel  Nausea/Vomiting:  Bonine Dramamine Emetrol Ginger extract Sea bands Meclizine  Nausea medication to take during pregnancy:  Unisom (doxylamine succinate 25 mg tablets) Take one tablet daily at bedtime. If symptoms are not adequately controlled, the dose can be increased to a maximum recommended dose of two tablets daily (1/2 tablet in the morning, 1/2 tablet mid-afternoon and one at bedtime). Vitamin B6 100mg  tablets. Take one tablet twice a day (up to 200 mg per day).  Skin Rashes: Aveeno products Benadryl cream or 25mg  every 6 hours as needed Calamine Lotion 1% cortisone cream  Yeast infection: Gyne-lotrimin 7 Monistat 7   **If taking multiple medications, please check labels to avoid duplicating the same active ingredients **take medication as  directed on the label ** Do not exceed 4000 mg of tylenol in 24 hours **Do not take medications that contain aspirin or ibuprofen

## 2020-10-12 NOTE — Progress Notes (Signed)
History:   Erica [name] is a 30 y.o. G3P0111 at 77w0dby 7 week ultrasound. She being seen today for her first obstetrical visit.  Her obstetrical history is significant for spontaneous preterm labor with delivery at 36 weeks 6 days. Patient does intend to breast feed. Pregnancy history fully reviewed.  Patient reports nausea, which is improving with previously prescribed Phenergan. She estimates two episodes of vomiting per day, typically first thing and again in the evening. Diet includes waffles, yogurt. Patient and her FOB are currently living in a hotel as they prepare to move out of state on 11/16/2020.    HISTORY: OB History  Gravida Para Term Preterm AB Living  3 1 0 _0 SAB IAB Ectopic Multiple Live Births  1 0 0 0 1    # Outcome Date GA Lbr Len/2nd Weight Sex Delivery Anes PTL Lv  3 Current           2 SAB 06/12/20 140w3d       1 Preterm 02/12/16 3629w6d:40 / 00:13 5 lb 15.1 oz (2.695 kg) F Vag-Spont EPI  LIV     Birth Comments: wnl     Name: Sorto,GIRL Arilla     Apgar1: 8  Apgar5: 9    Last pap smear was done within the past three years and was normal (per patient)  Past Medical History:  Diagnosis Date  . Anemia   . Chlamydia   . Trichomonas infection    Past Surgical History:  Procedure Laterality Date  . MULTIPLE TOOTH EXTRACTIONS     Family History  Problem Relation Age of Onset  . Sarcoidosis Mother   . Alcohol abuse Neg Hx   . Arthritis Neg Hx   . Asthma Neg Hx   . Birth defects Neg Hx   . Cancer Neg Hx   . COPD Neg Hx   . Depression Neg Hx   . Diabetes Neg Hx   . Early death Neg Hx   . Drug abuse Neg Hx   . Hearing loss Neg Hx   . Heart disease Neg Hx   . Hyperlipidemia Neg Hx   . Hypertension Neg Hx   . Kidney disease Neg Hx   . Learning disabilities Neg Hx   . Mental illness Neg Hx   . Mental retardation Neg Hx   . Miscarriages / Stillbirths Neg Hx   . Stroke Neg Hx   . Vision loss Neg Hx   . Varicose Veins Neg Hx     Social History   Tobacco Use  . Smoking status: Former Smoker    Packs/day: 0.50    Types: Cigarettes    Quit date: 09/2019    Years since quitting: 1.0  . Smokeless tobacco: Never Used  Vaping Use  . Vaping Use: Former  Substance Use Topics  . Alcohol use: Not Currently    Comment: occ  . Drug use: No   No Known Allergies Current Outpatient Medications on File Prior to Visit  Medication Sig Dispense Refill  . Prenatal Multivit-Min-Fe-FA (PRENATAL/IRON) TABS Take 1 tablet by mouth daily.    . iMarland Kitchenuprofen (ADVIL) 600 MG tablet Take 1 tablet (600 mg total) by mouth every 6 (six) hours as needed for mild pain. 30 tablet 0  . misoprostol (CYTOTEC) 200 MCG tablet Place 4 tablets (800 mcg total) vaginally once for 1 dose. 4 tablet 0  . ondansetron (ZOFRAN ODT) 8 MG disintegrating tablet Take 1 tablet (8 mg total)  by mouth every 8 (eight) hours as needed for nausea or vomiting. 30 tablet 0  . oxyCODONE-acetaminophen (PERCOCET) 5-325 MG tablet Take 1-2 tablets by mouth every 4 (four) hours as needed for severe pain. 15 tablet 0   No current facility-administered medications on file prior to visit.    Review of Systems Pertinent items noted in HPI and remainder of comprehensive ROS otherwise negative.  Physical Exam:   Vitals:   10/12/20 1044  BP: 115/75  Pulse: (!) 104  Weight: 125 lb 8 oz (56.9 kg)   Fetal Heart Rate (bpm): 163  General: well-developed, well-nourished female in no acute distress  Skin: normal coloration and turgor, no rashes  Neurologic: oriented, normal, negative, normal mood  Extremities: normal strength, tone, and muscle mass, ROM of all joints is normal  HEENT PERRLA, extraocular movement intact and sclera clear, anicteric  Neck supple and no masses  Cardiovascular: regular rate and rhythm  Respiratory:  no respiratory distress, normal breath sounds  Abdomen: soft, non-tender; bowel sounds normal; no masses,  no organomegaly    Assessment:     Pregnancy: [id number] Patient Active Problem List   Diagnosis Date Noted  . Supervision of low-risk pregnancy 10/12/2020  . Miscarriage 06/12/2020  . Threatened miscarriage in early pregnancy 05/22/2020  . Anemia affecting pregnancy 02/12/2016  . Vaginal delivery 02/12/2016     Plan:    1. Encounter for supervision of low-risk pregnancy, antepartum - Routine care, dating by 7 week [place] - Pt elected to defer genetic screening until next visit - Moving out of state April 1. Discussed anatomy scan as major milestone she will need to navigate shortly after moving - Mild emesis, Phenergan refilled advised reducing dairy, strong flavors - Blood Pressure Monitoring (BLOOD PRESSURE KIT) DEVI; 1 Device by Does not apply route as needed.  Dispense: 1 each; Refill: 0 - [facility] AMB BABYSCRIPTS SCHEDULE OPTIMIZATION - Culture, OB Urine - GC/Chlamydia probe amp ([place])not at [facility]; Future - promethazine (PHENERGAN) 25 MG tablet; Take 1 tablet (25 mg total) by mouth every 6 (six) hours as needed for nausea or vomiting.  Dispense: 120 tablet; Refill: 2 - [place] MFM OB COMP + 14 WK; Future  2. Nausea and vomiting during pregnancy prior to [redacted] weeks gestation  - promethazine (PHENERGAN) 25 MG tablet; Take 1 tablet (25 mg total) by mouth every 6 (six) hours as needed for nausea or vomiting.  Dispense: 120 tablet; Refill: 2  3. [redacted] weeks gestation of pregnancy    Initial labs drawn. Continue prenatal vitamins. Problem list reviewed and updated. Genetic Screening discussed, First trimester screen, Quad screen and NIPS: deferred per patient request. Ultrasound discussed; fetal anatomic survey: not ordered as patient will be moving to [place]. Anticipatory guidance about prenatal visits given including labs, ultrasounds, and testing. Discussed usage of Babyscripts and virtual visits as additional source of managing and completing prenatal visits in midst of coronavirus and pandemic.    Encouraged to complete MyChart Registration for her ability to review results, send requests, and have questions addressed.  The nature of [facility] with multiple MDs and Advanced Practice Providers was explained to patient; also emphasized that residents, students are part of our team. Routine obstetric precautions reviewed. Encouraged to seek out care at office or emergency room [facility] preferred) for urgent and/or emergent concerns. Return in about 2 weeks for genetic screening (around 10/26/2020).  Next OB visit in four weeks    Aldona Bar  Jeronimo Greaves, MSN, [place] Certified Nurse Midwife, [employer] for [employer], [place] [facility] 10/12/20 3:31 PM

## 2020-10-12 NOTE — Progress Notes (Signed)
Here for initial prenatal visit. Has MyChart. Ordered bp cuff; if not covered states can buy one. Sent Babyscripts also.  Will sign release for pap from [facility]. Savaughn Karwowski,RN

## 2020-10-14 LAB — CBC/D/PLT+RPR+RH+ABO+RUB AB...
Antibody Screen: NEGATIVE
Basophils Absolute: 0 10*3/uL (ref 0.0–0.2)
Basos: 1 %
EOS (ABSOLUTE): 0.2 10*3/uL (ref 0.0–0.4)
Eos: 3 %
HCV Ab: <0.1 s/co ratio (ref 0.0–0.9)
HIV Screen 4th Generation wRfx: NONREACTIVE
Hematocrit: 36.1 % (ref 34.0–46.6)
Hemoglobin: 12 g/dL (ref 11.1–15.9)
Hepatitis B Surface Ag: NEGATIVE
Immature Grans (Abs): 0 10*3/uL (ref 0.0–0.1)
Immature Granulocytes: 0 %
Lymphocytes Absolute: 1.4 10*3/uL (ref 0.7–3.1)
Lymphs: 25 %
MCH: 30 pg (ref 26.6–33.0)
MCHC: 33.2 g/dL (ref 31.5–35.7)
MCV: 90 fL (ref 79–97)
Monocytes Absolute: 0.7 10*3/uL (ref 0.1–0.9)
Monocytes: 12 %
Neutrophils Absolute: 3.2 10*3/uL (ref 1.4–7.0)
Neutrophils: 59 %
Platelets: 316 10*3/uL (ref 150–450)
RBC: 4 x10E6/uL (ref 3.77–5.28)
RDW: 14.1 % (ref 11.7–15.4)
RPR Ser Ql: NONREACTIVE
Rh Factor: POSITIVE
Rubella Antibodies, IGG: 1.98 index (ref >0.99)
WBC: 5.5 10*3/uL (ref 3.4–10.8)

## 2020-10-14 LAB — HCV INTERPRETATION

## 2020-10-14 LAB — HEMOGLOBIN A1C
Est. average glucose Bld gHb Est-mCnc: 103 mg/dL
Hgb A1c MFr Bld: 5.2 % (ref 4.8–5.6)

## 2020-10-15 LAB — GC/CHLAMYDIA PROBE AMP (~~LOC~~) NOT AT ARMC
Chlamydia: NEGATIVE
Comment: NEGATIVE
Comment: NORMAL
Neisseria Gonorrhea: NEGATIVE

## 2020-10-16 LAB — URINE CULTURE, OB REFLEX

## 2020-10-16 LAB — CULTURE, OB URINE

## 2020-10-23 ENCOUNTER — Other Ambulatory Visit: Payer: Self-pay | Admitting: Lactation Services

## 2020-10-23 DIAGNOSIS — Z3490 Encounter for supervision of normal pregnancy, unspecified, unspecified trimester: Secondary | ICD-10-CM

## 2020-10-26 ENCOUNTER — Other Ambulatory Visit: Payer: BC Managed Care – PPO

## 2020-10-26 ENCOUNTER — Other Ambulatory Visit: Payer: Self-pay

## 2020-10-26 DIAGNOSIS — Z3490 Encounter for supervision of normal pregnancy, unspecified, unspecified trimester: Secondary | ICD-10-CM

## 2020-11-09 ENCOUNTER — Other Ambulatory Visit: Payer: Self-pay

## 2020-11-09 ENCOUNTER — Ambulatory Visit (INDEPENDENT_AMBULATORY_CARE_PROVIDER_SITE_OTHER): Payer: BC Managed Care – PPO | Admitting: Medical

## 2020-11-09 VITALS — BP 115/77 | HR 98 | Wt 127.0 lb

## 2020-11-09 DIAGNOSIS — Z3A14 14 weeks gestation of pregnancy: Secondary | ICD-10-CM

## 2020-11-09 DIAGNOSIS — Z3490 Encounter for supervision of normal pregnancy, unspecified, unspecified trimester: Secondary | ICD-10-CM

## 2020-11-09 DIAGNOSIS — O09891 Supervision of other high risk pregnancies, first trimester: Secondary | ICD-10-CM

## 2020-11-09 NOTE — Progress Notes (Signed)
Declines flu shot. Kinesha Auten,RN

## 2020-11-09 NOTE — Progress Notes (Signed)
   PRENATAL VISIT NOTE  Subjective:  Erica [name] is a 30 y.o. [id number] at [redacted]w[redacted]d being seen today for ongoing prenatal care.  She is currently monitored for the following issues for this low-risk pregnancy and has Anemia affecting pregnancy; Vaginal delivery; Threatened miscarriage in early pregnancy; Miscarriage; Supervision of low-risk pregnancy; and Hx of preterm delivery, currently pregnant, first trimester on their problem list.  Patient reports no complaints.  Contractions: Not present. Vag. Bleeding: None.  Movement: Absent. Denies leaking of fluid.   The following portions of the patient's history were reviewed and updated as appropriate: allergies, current medications, past family history, past medical history, past social history, past surgical history and problem list.   Objective:   Vitals:   11/09/20 0935  BP: 115/77  Pulse: 98  Weight: 127 lb (57.6 kg)    Fetal Status: Fetal Heart Rate (bpm): 148   Movement: Absent     General:  Alert, oriented and cooperative. Patient is in no acute distress.  Skin: Skin is warm and dry. No rash noted.   Cardiovascular: Normal heart rate noted  Respiratory: Normal respiratory effort, no problems with respiration noted  Abdomen: Soft, gravid, appropriate for gestational age.  Pain/Pressure: Absent     Pelvic: Cervical exam deferred        Extremities: Normal range of motion.  Edema: None  Mental Status: Normal mood and affect. Normal behavior. Normal judgment and thought content.   Assessment and Plan:  Pregnancy: [id number] at [redacted]w[redacted]d 1. Encounter for supervision of low-risk pregnancy, antepartum - Doing well, patient is moving to [place] in a few weeks, will resume care there - Discussed appropriate timeline for care and warning signs until established there   2. Hx of preterm delivery, currently pregnant, first trimester  3. [redacted] weeks gestation of pregnancy  Preterm labor/ second trimester warning symptoms and general obstetric  precautions including but not limited to vaginal bleeding, contractions, leaking of fluid and fetal movement were reviewed in detail with the patient. Please refer to After Visit Summary for other counseling recommendations.   Return if symptoms worsen or fail to improve.  Future Appointments  Date Time Provider Department Center  12/14/2020 10:15 AM [facility] US2 [facility]    Vonzella Nipple, PA-C

## 2020-11-09 NOTE — Patient Instructions (Signed)

## 2020-11-22 ENCOUNTER — Encounter: Payer: Self-pay | Admitting: *Deleted

## 2020-11-25 IMAGING — US US OB < 14 WEEKS - US OB TV
1 series · 15 of 28 positions shown · non-contrast
Comparison: None.

CLINICAL DATA: Ten weeks pregnant.  Bleeding.

EXAM:
OBSTETRIC <14 WK US AND TRANSVAGINAL OB US
TECHNIQUE: Both transabdominal and transvaginal ultrasound examinations were
performed for complete evaluation of the gestation as well as the
maternal uterus, adnexal regions, and pelvic cul-de-sac.
Transvaginal technique was performed to assess early pregnancy.

[Series 1: us ob < 14 weeks - us ob tv · 40 acquisitions, 15 frames shown]
[im 1/40]
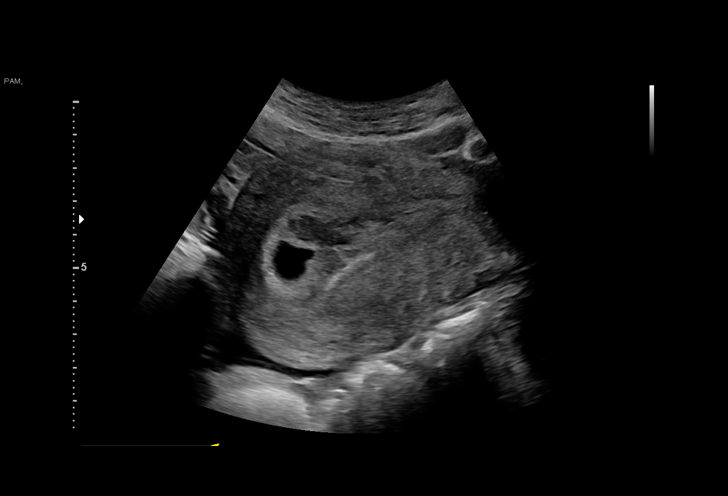
[im 3/40]
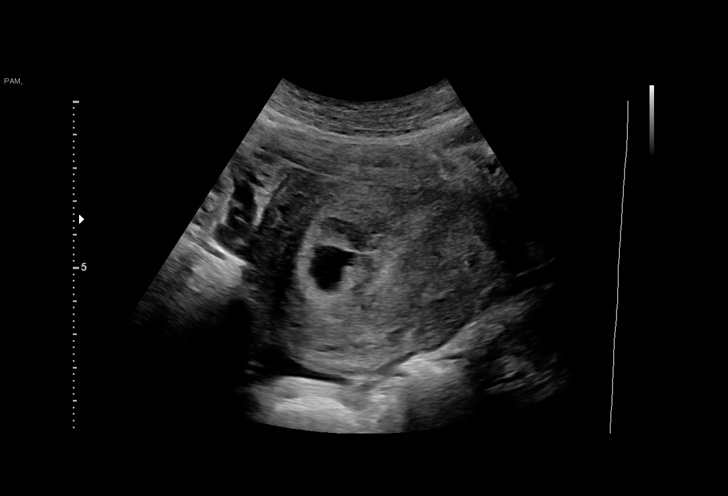
[im 6/40]
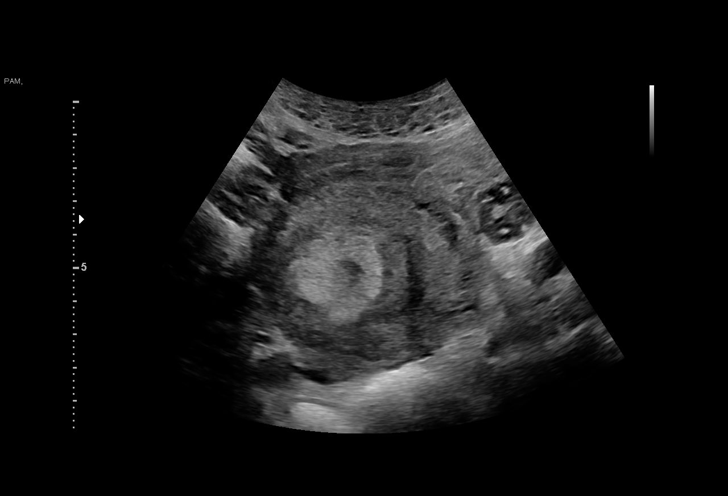
[im 9/40]
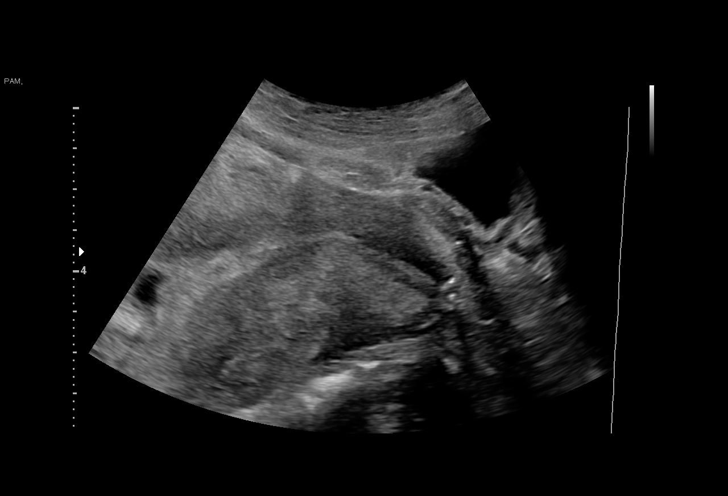
[im 12/40]
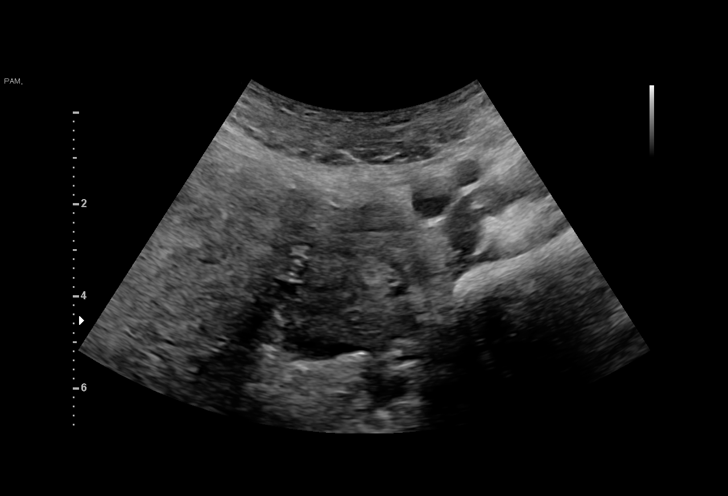
[im 15/40]
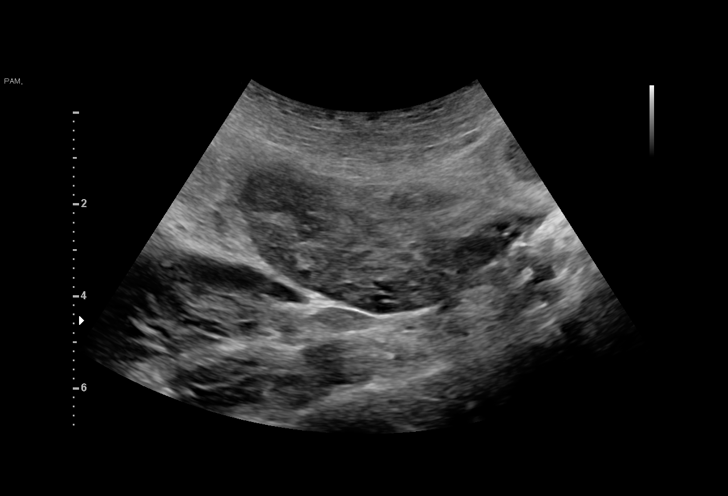
[im 18/40]
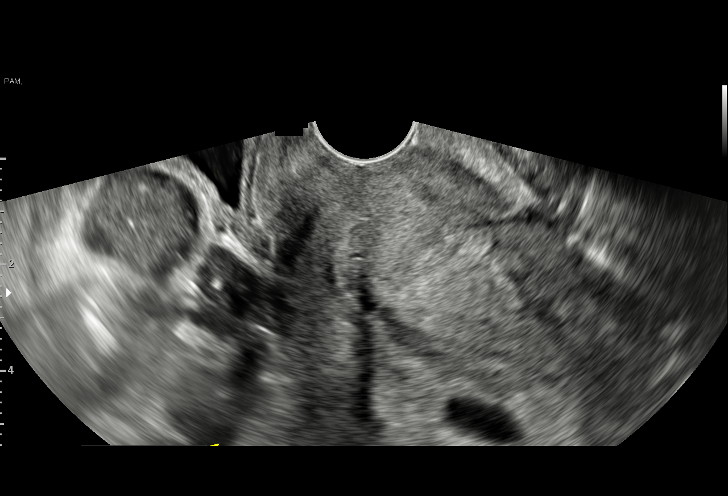
[im 21/40]
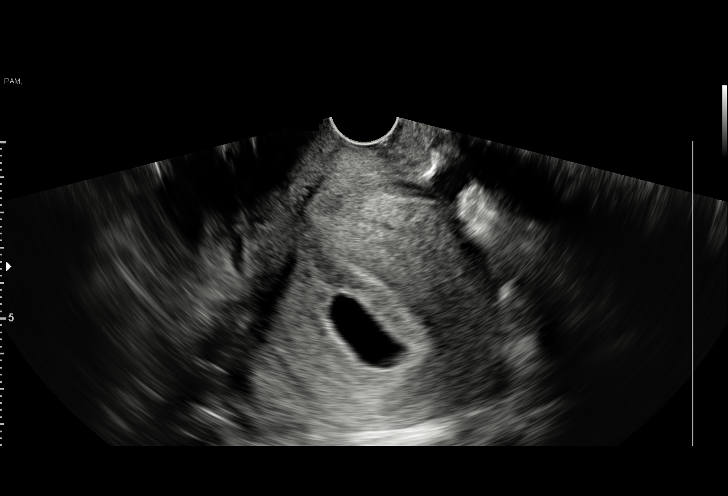
[im 22/40]
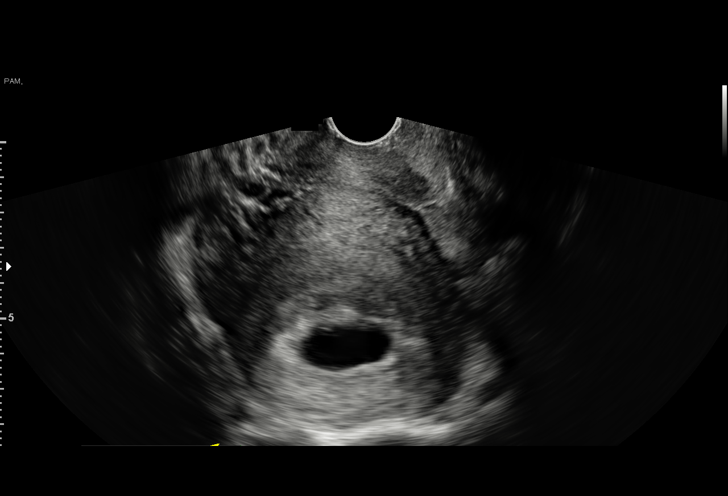
[im 25/40]
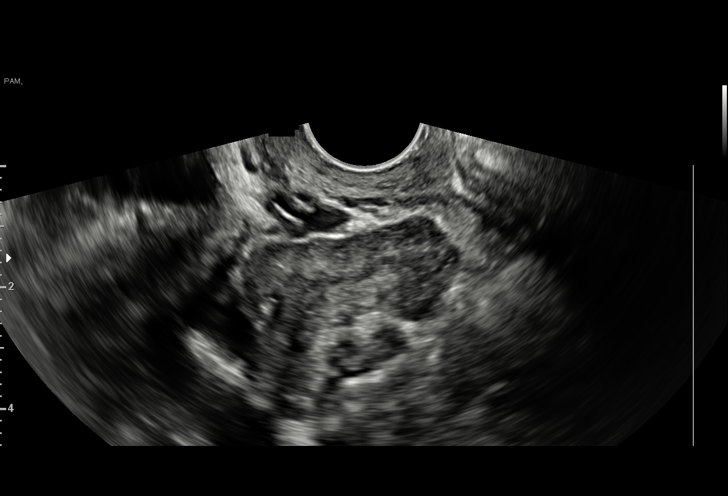
[im 28/40]
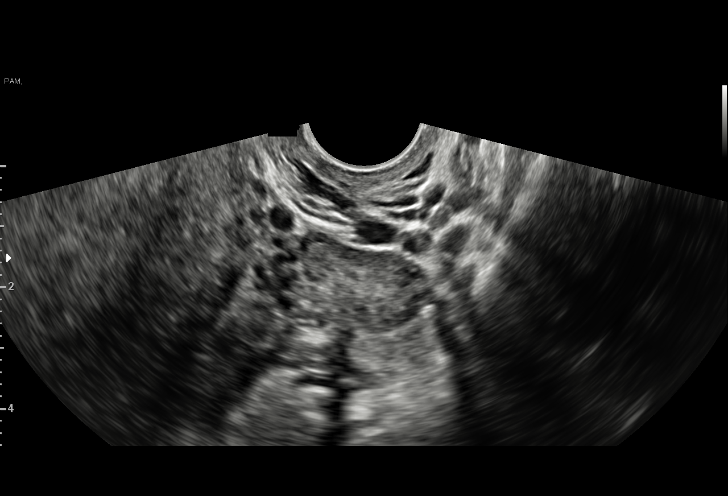
[im 31/40]
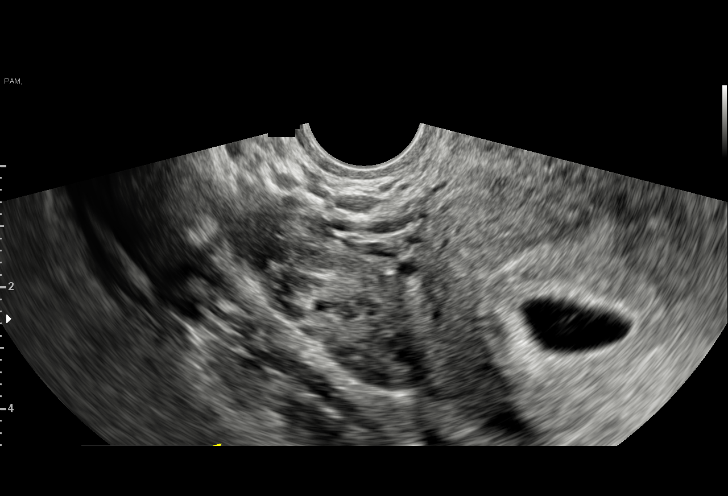
[im 34/40]
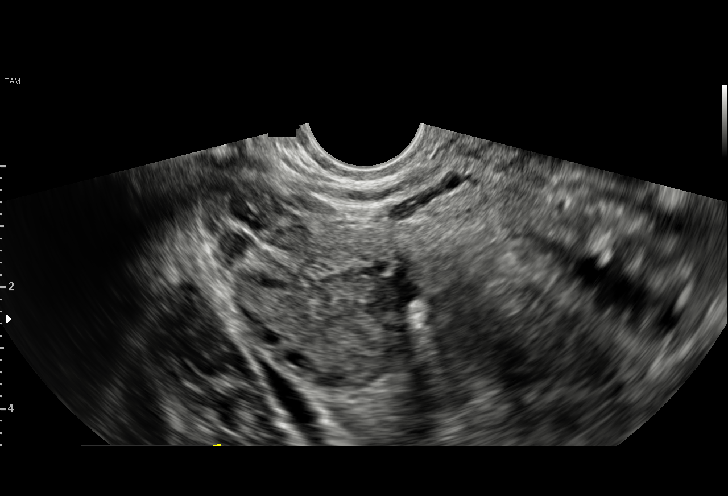
[im 37/40]
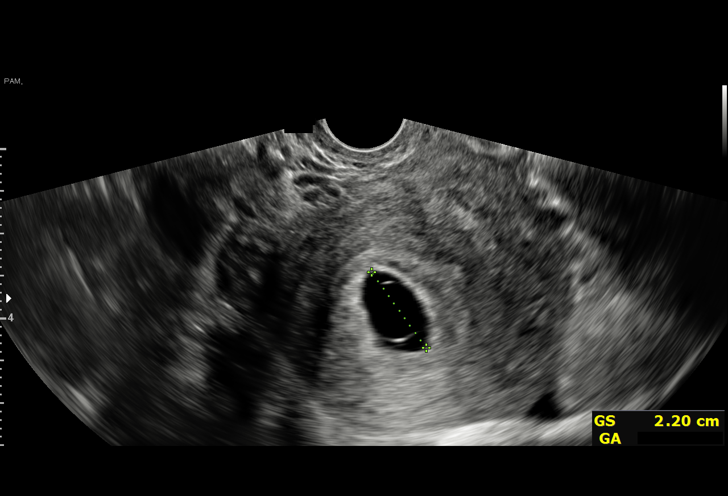
[im 40/40]
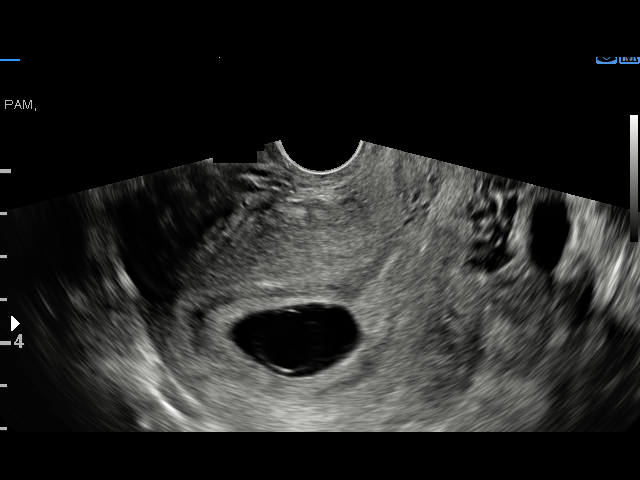

[15 of 28 positions shown; findings below may reference images not displayed]

FINDINGS: Intrauterine gestational sac: Single

Yolk sac:  Visualized.  Measuring 14 mm.

Embryo:  Not visualized.

Cardiac Activity: Not visualized.

Heart Rate: Does not apply bpm

MSD: 22 mm   7 w   0 d

CRL:    mm    w    d                  US EDC:

Subchorionic hemorrhage:  None visualized.

Maternal uterus/adnexae: Bilateral ovaries are normal.
IMPRESSION: Findings consistent with abortion in progress. The yolk sac is
visualized but no embryo is noted.

## 2020-11-27 ENCOUNTER — Encounter: Payer: Self-pay | Admitting: General Practice

## 2020-11-29 ENCOUNTER — Telehealth: Payer: Self-pay | Admitting: Lactation Services

## 2020-11-29 NOTE — Telephone Encounter (Signed)
Patient called and LM on Lactation voicemail that she would like a ROI form emailed to her to get her records transferred to her new OB.   Returned patients call and informed her that she either needs to come to office to sign ROI form or have new OB send ROI. She reports new OB will not see her until they have her records. She has moved out of state. Will forward to front office for follow up.

## 2020-12-04 NOTE — Telephone Encounter (Signed)
Called patient to inform her that ROI needs to be in person with Hipp[facility]. She has an appointment on April 29 in this office and will sign ROI at this time and then transfer care.

## 2020-12-14 ENCOUNTER — Other Ambulatory Visit: Payer: Self-pay

## 2020-12-14 ENCOUNTER — Ambulatory Visit: Payer: BC Managed Care – PPO | Attending: Advanced Practice Midwife

## 2020-12-14 DIAGNOSIS — Z3490 Encounter for supervision of normal pregnancy, unspecified, unspecified trimester: Secondary | ICD-10-CM | POA: Diagnosis present

## 2021-03-23 IMAGING — US US OB < 14 WEEKS - US OB TV
1 series · 15 of 28 positions shown · non-contrast
Comparison: May 26, 2020

CLINICAL DATA: Viability

EXAM:
OBSTETRIC <14 WK US AND TRANSVAGINAL OB US
TECHNIQUE: Both transabdominal and transvaginal ultrasound examinations were
performed for complete evaluation of the gestation as well as the
maternal uterus, adnexal regions, and pelvic cul-de-sac.
Transvaginal technique was performed to assess early pregnancy.

[Series 1: us ob < 14 weeks - us ob tv · 15 of 29 slices shown]
[im 1/29]
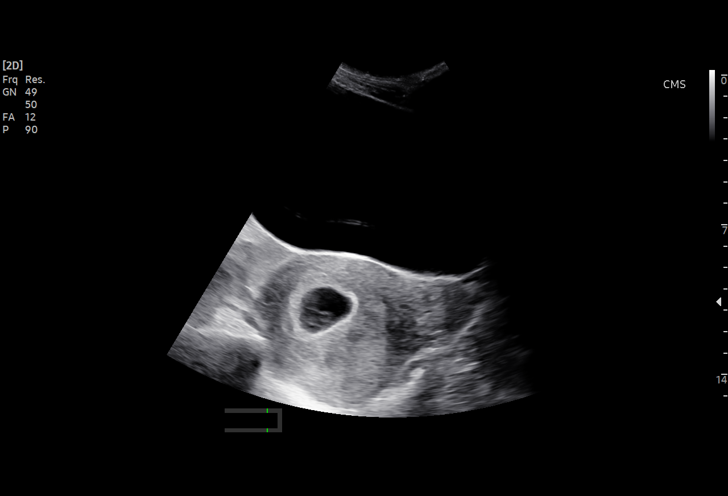
[im 3/29]
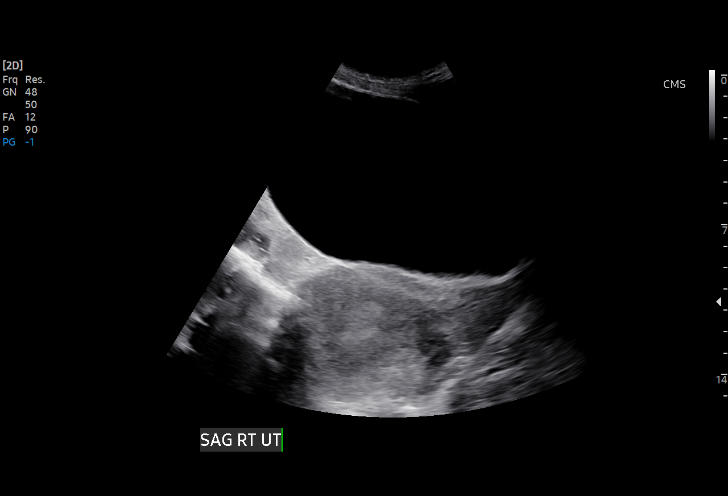
[im 5/29]
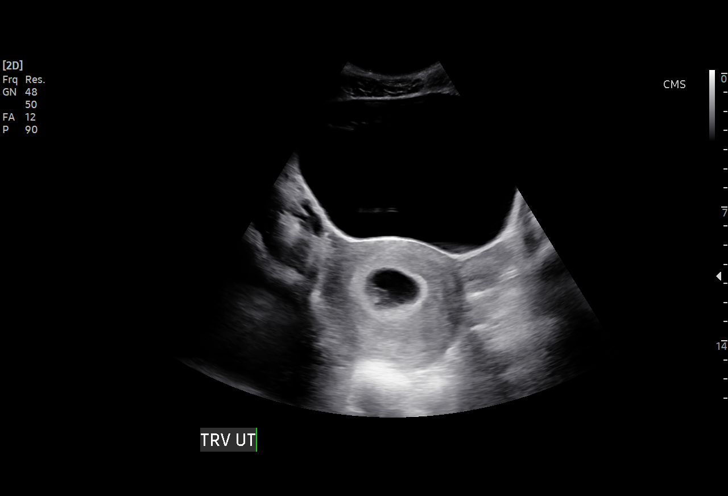
[im 7/29]
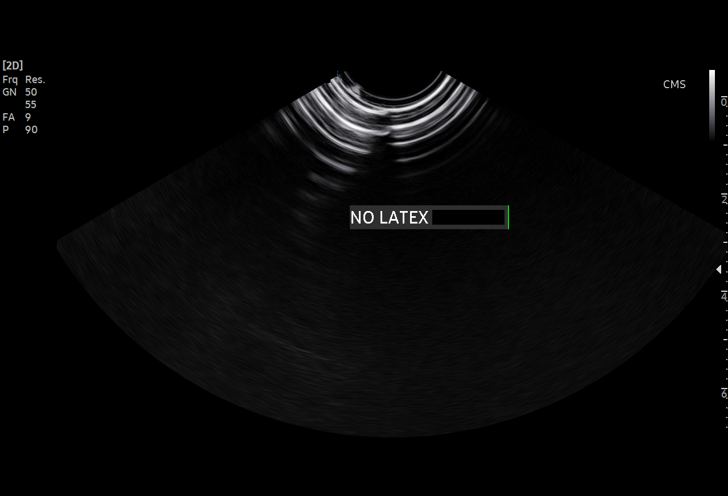
[im 9/29]
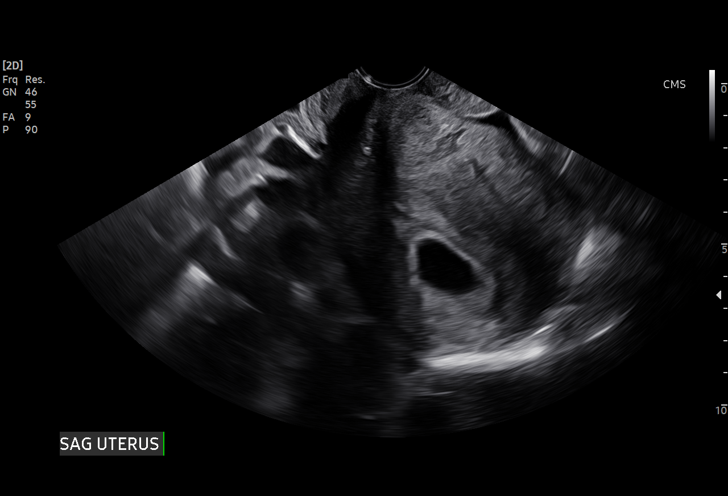
[im 11/29]
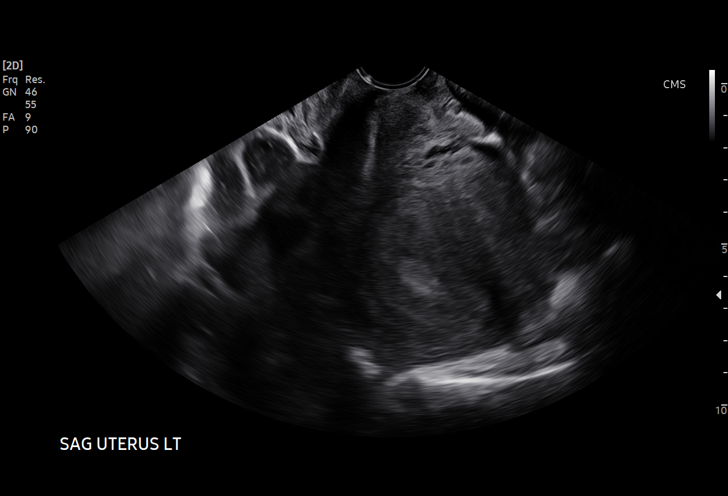
[im 13/29]
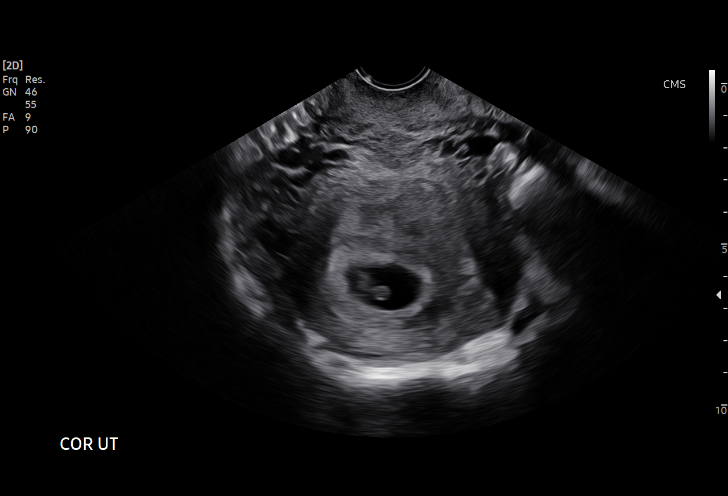
[im 15/29]
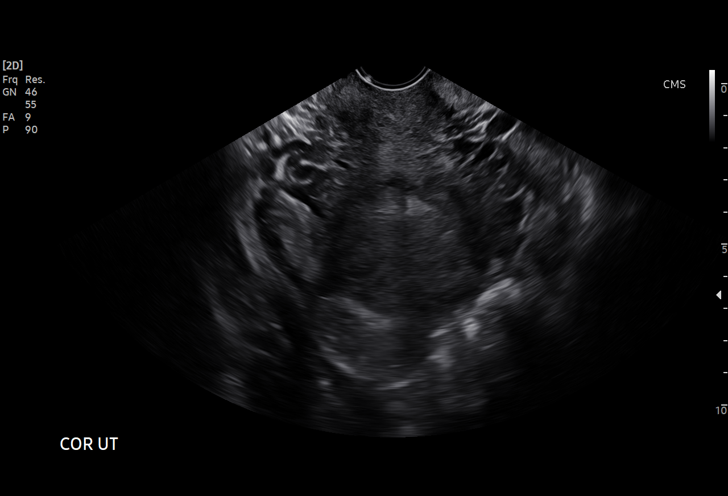
[im 16/29]
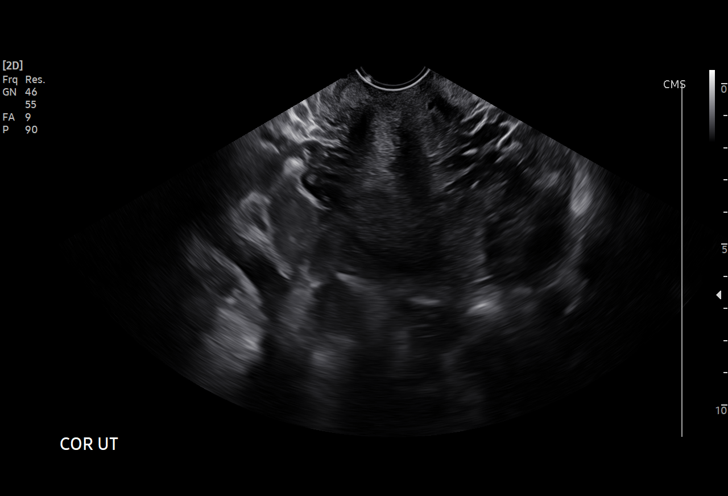
[im 18/29]
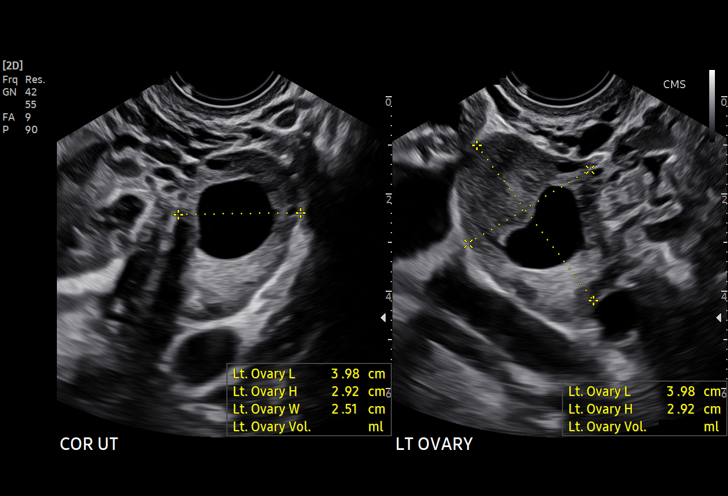
[im 20/29]
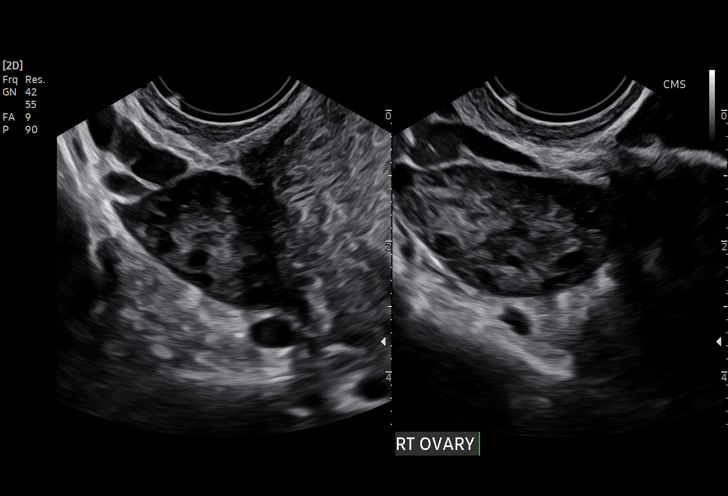
[im 22/29]
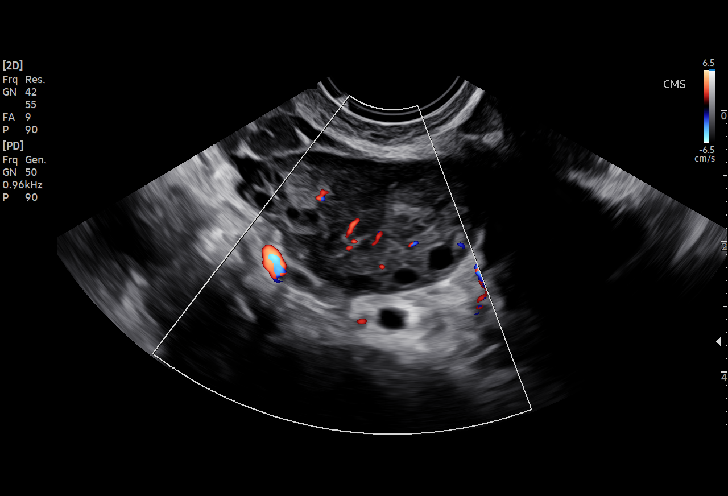
[im 24/29]
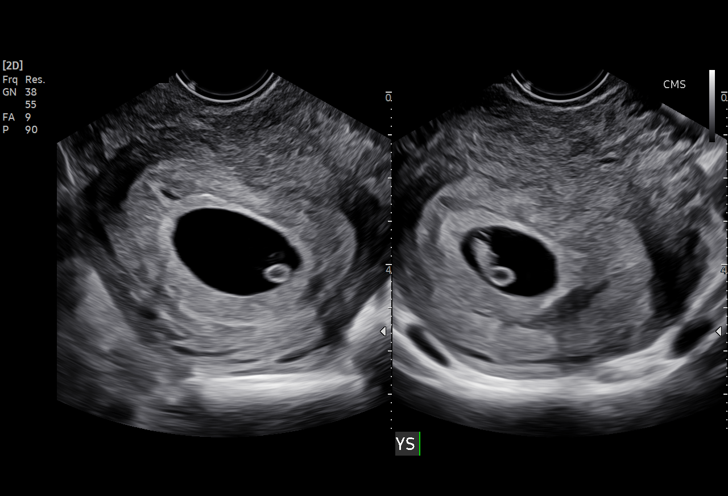
[im 26/29]
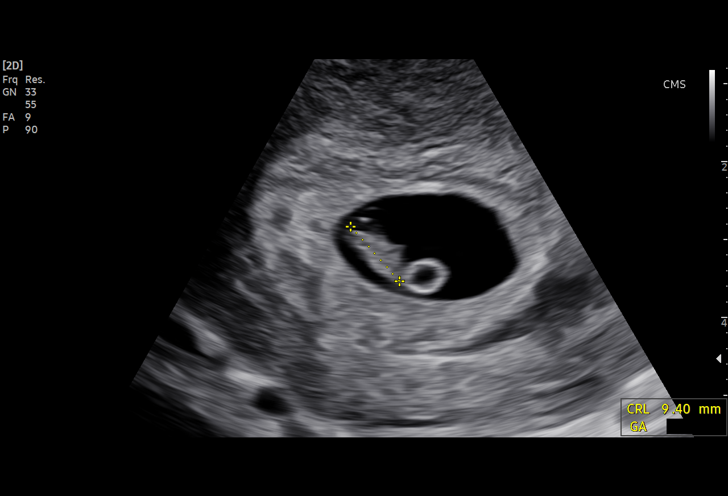
[im 29/29]
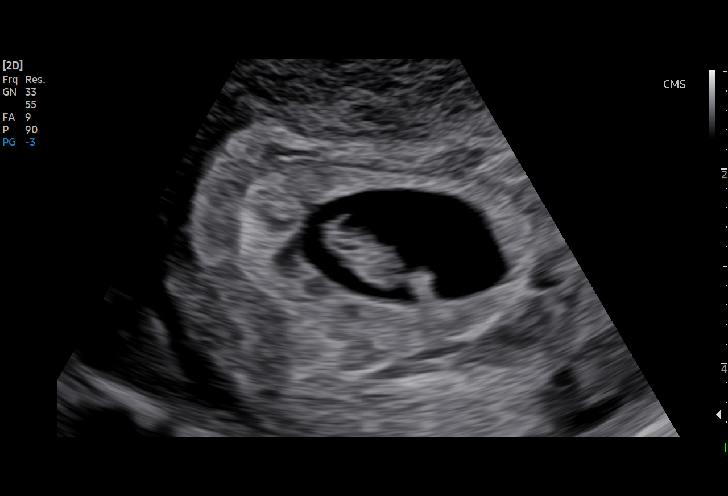

[15 of 28 positions shown; findings below may reference images not displayed]

FINDINGS: Intrauterine gestational sac: Single

Yolk sac:  Visualized.

Embryo:  Visualized.

Cardiac Activity: Visualized.

Heart Rate: 144 bpm

CRL:  9.6 mm 7 w 0 d                  US EDC: 05/10/2021

LMP: 08/03/2020. Gestational age by LMP is 7 weeks 0 days. EDC by
LMP is 05/10/2021.

Subchorionic hemorrhage: None

Right ovary: RIGHT ovary demonstrates several small follicles. It
measures 3.3 x 1.8 x 2.2 cm.

Left ovary: There is a corpus luteal cyst in the LEFT ovary. LEFT
ovary measures 4.0 x 2.9 x 2.5 cm.

Other :None

Free fluid:  None
IMPRESSION: 1. There is a single live intrauterine pregnancy. Gestational age
assigned by LMP is 7 weeks 0 days. EDC is 05/10/2021

## 2021-06-15 IMAGING — US US MFM OB COMP +14 WKS
1 series · 14 of 28 positions shown · non-contrast
Comparison: none

[Series 1: us mfm ob comp +14 wks · 150 acquisitions, 14 frames shown]
[im 6/150]
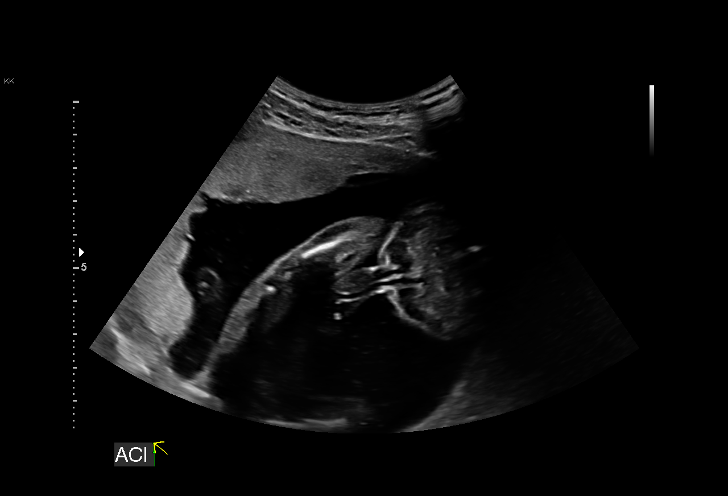
[im 17/150]
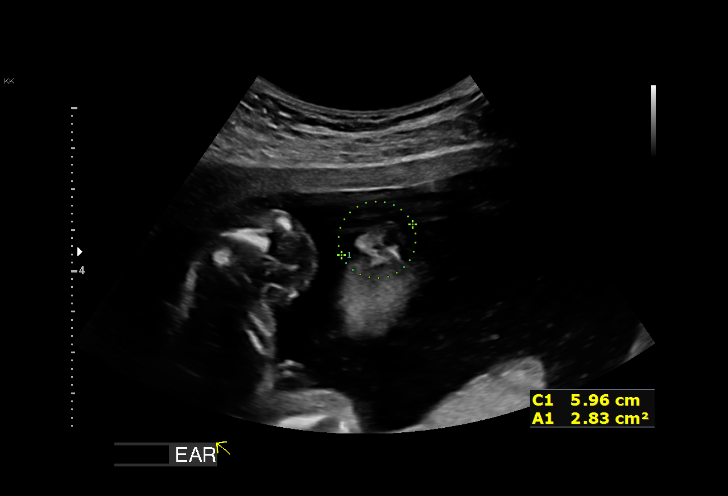
[im 28/150]
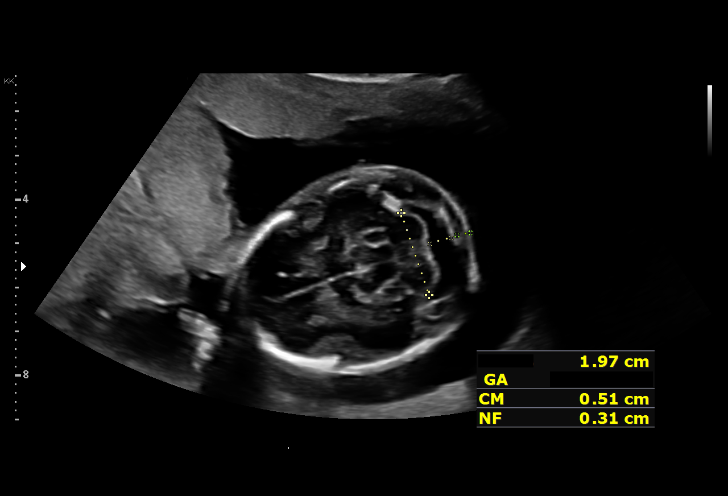
[im 39/150]
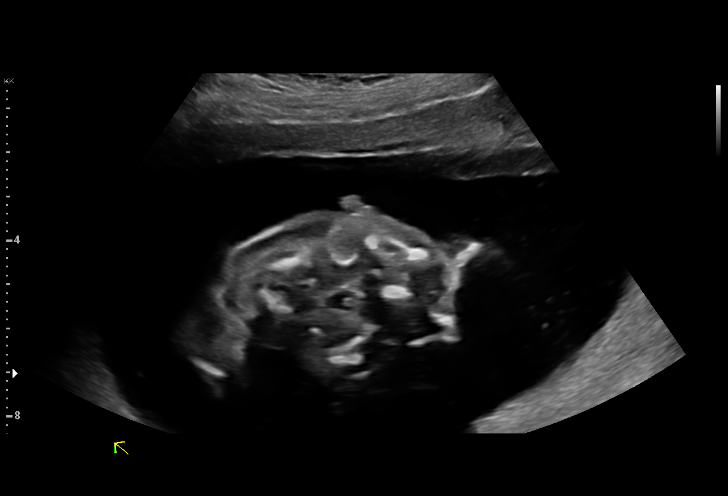
[im 50/150]
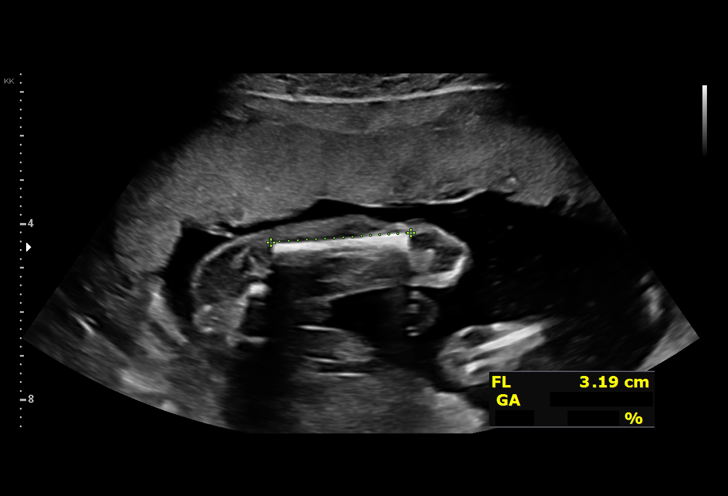
[im 61/150]
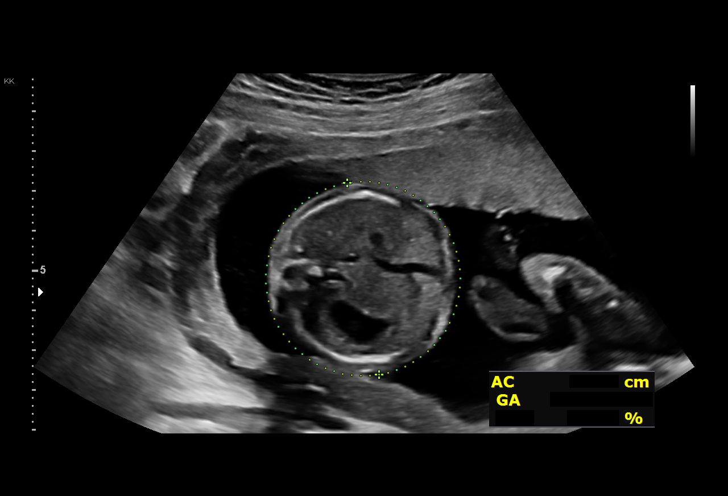
[im 72/150]
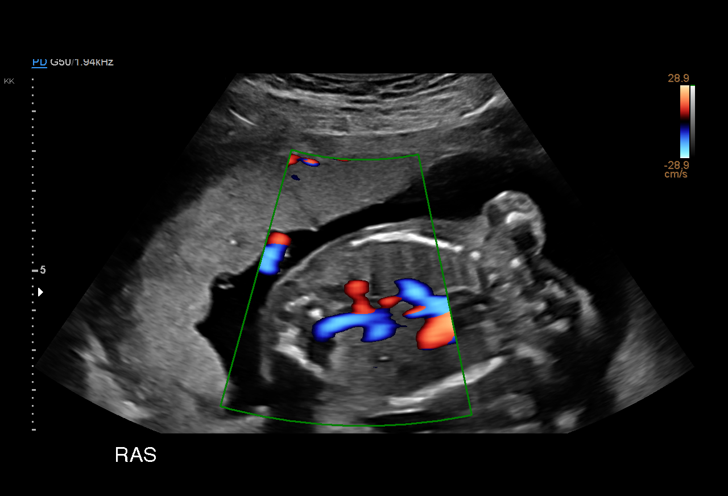
[im 83/150]
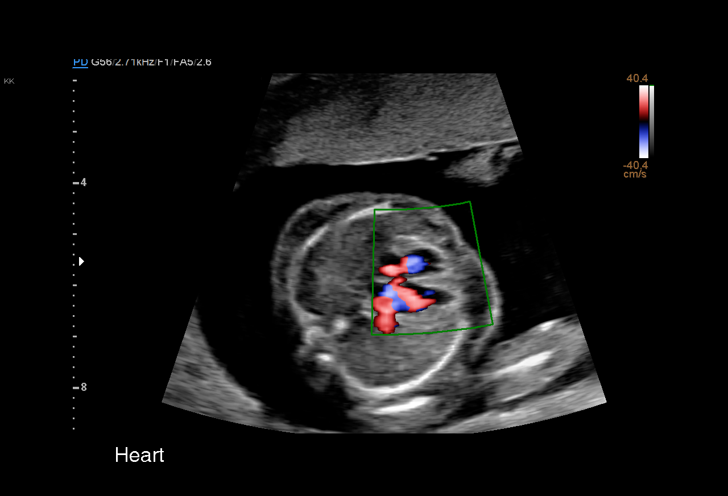
[im 94/150]
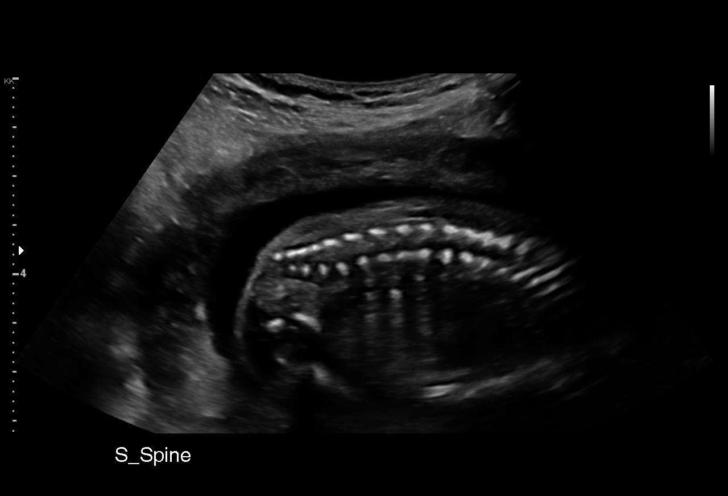
[im 105/150]
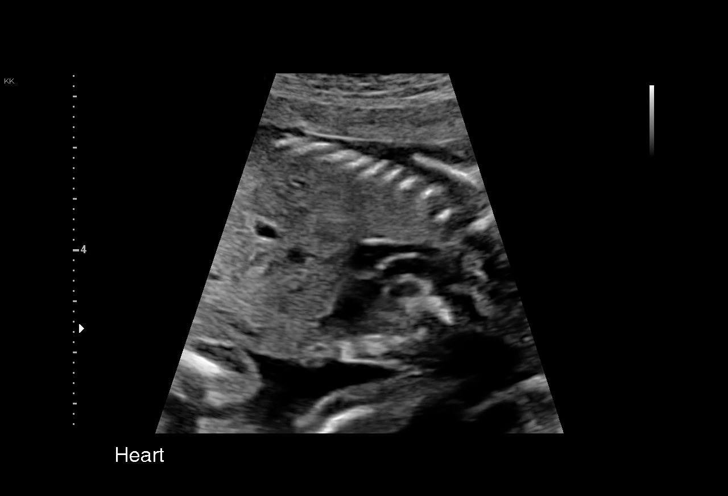
[im 116/150]
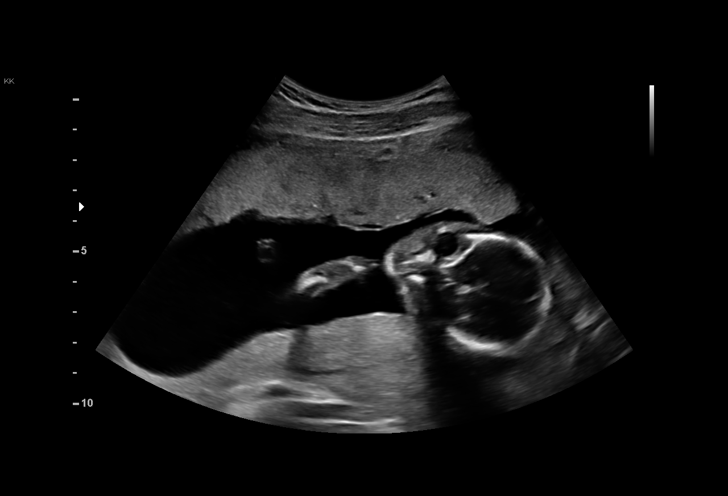
[im 127/150]
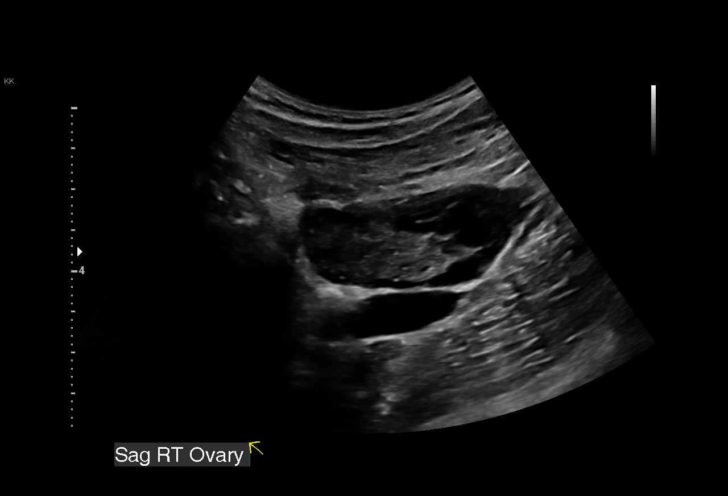
[im 138/150]
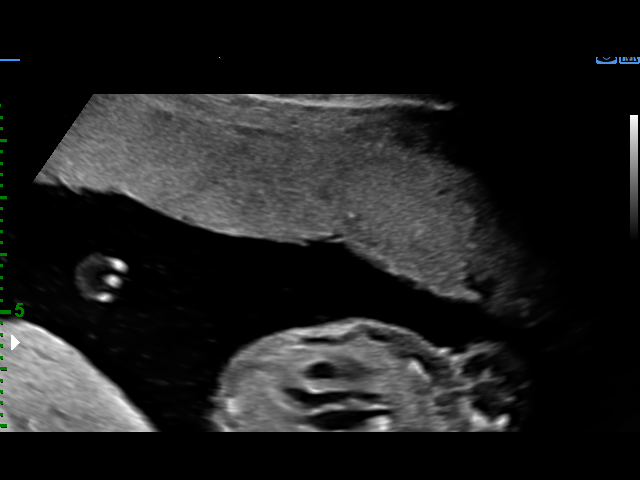
[im 150/150]
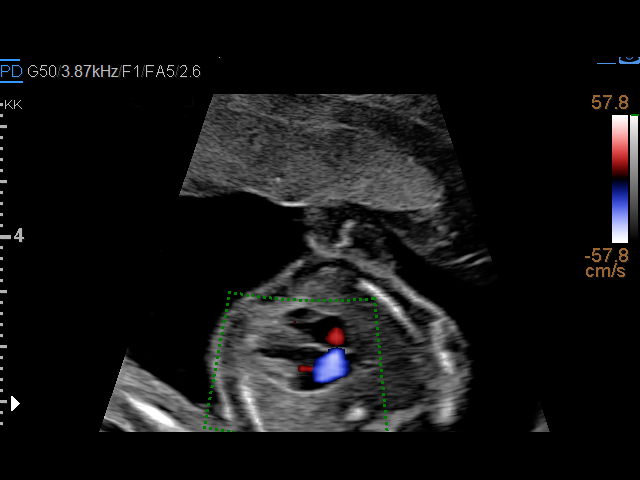

[14 of 28 positions shown; findings below may reference images not displayed]

[id number]

                                                      [facility]

Indications

 19 weeks gestation of pregnancy
 Poor obstetric history: Previous preterm
 delivery, antepartum (36w 6d)
 Encounter for antenatal screening for
 malformations
 Low Risk NIPS(Negative Horizon[DATE])
Fetal Evaluation

 Num Of Fetuses:          1
 Fetal Heart Rate(bpm):   152
 Cardiac Activity:        Observed
 Presentation:            Cephalic
 Placenta:                Anterior
 P. Cord Insertion:       Visualized

 Amniotic Fluid
 AFI FV:      Within normal limits

                             Largest Pocket(cm)

Biometry
 BPD:      43.3  mm     G. Age:  19w 1d         55  %    CI:        71.77   %    70 - 86
                                                         FL/HC:       19.5  %    16.1 -
 HC:      162.7  mm     G. Age:  19w 0d         44  %    HC/AC:       1.09       1.09 -
 AC:      149.5  mm     G. Age:  20w 1d         83  %    FL/BPD:      73.4  %
 FL:       31.8  mm     G. Age:  19w 6d         75  %    FL/AC:       21.3  %    20 - 24
 CER:      19.7  mm     G. Age:  19w 1d         40  %
 NFT:       3.1  mm
 LV:        5.8  mm
 CM:        5.1  mm

 Est. FW:     321   gm    0 lb 11 oz     92  %
OB History

 Gravidity:    3         Term:   0        Prem:   1        SAB:   1
 TOP:          0       Ectopic:  0        Living: 1
Gestational Age

 LMP:           19w 0d        Date:  08/03/20                 EDD:   05/10/21
 U/S Today:     19w 4d                                        EDD:   05/06/21
 Best:          19w 0d     Det. By:  LMP  (08/03/20)          EDD:   05/10/21
Anatomy

 Cranium:               Appears normal         Aortic Arch:            Appears normal
 Cavum:                 Appears normal         Ductal Arch:            Appears normal
 Ventricles:            Appears normal         Diaphragm:              Appears normal
 Choroid Plexus:        Appears normal         Stomach:                Appears normal, left
                                                                       sided
 Cerebellum:            Appears normal         Abdomen:                Appears normal
 Posterior Fossa:       Appears normal         Abdominal Wall:         Appears nml (cord
                                                                       insert, abd wall)
 Nuchal Fold:           Appears normal         Cord Vessels:           Appears normal (3
                                                                       vessel cord)
 Face:                  Appears normal         Kidneys:                Appear normal
                        (orbits and profile)
 Lips:                  Appears normal         Bladder:                Appears normal
 Thoracic:              Appears normal         Spine:                  Appears normal
 Heart:                 Appears normal         Upper Extremities:      Appears normal
                        (4CH, axis, and
                        situs)
 RVOT:                  Appears normal         Lower Extremities:      Appears normal
 LVOT:                  Appears normal

 Other:  Fetus appears to be a male. Heels visualized. Hands visualized.
Cervix Uterus Adnexa

 Cervix
 Length:           3.47  cm.
 Normal appearance by transabdominal scan.

 Uterus
 No abnormality visualized.
 Right Ovary
 Within normal limits.

 Left Ovary
 Within normal limits.

 Adnexa
 No abnormality visualized.
Impression

 Single intrauterine pregnancy here for a detailed anatomy
 Normal anatomy with measurements consistent with dates
 There is good fetal movement and amniotic fluid volume
 Low risk NIPS.
Recommendations

 Follow up as clinically indicated.
